# Patient Record
Sex: Female | Born: 1995 | Hispanic: No | Marital: Single | Smoking: Never smoker
Health system: Southern US, Community
[De-identification: ages and names within clinical notes are randomized; demographics above are authoritative.]

## PROBLEM LIST (undated history)

## (undated) ENCOUNTER — Inpatient Hospital Stay (HOSPITAL_COMMUNITY): Payer: Self-pay

## (undated) ENCOUNTER — Emergency Department (HOSPITAL_COMMUNITY): Admission: EM | Payer: Self-pay | Source: Home / Self Care

## (undated) DIAGNOSIS — Z789 Other specified health status: Secondary | ICD-10-CM

## (undated) DIAGNOSIS — J45909 Unspecified asthma, uncomplicated: Secondary | ICD-10-CM

## (undated) HISTORY — PX: NO PAST SURGERIES: SHX2092

## (undated) HISTORY — DX: Other specified health status: Z78.9

---
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
## 2016-08-26 ENCOUNTER — Ambulatory Visit: Payer: Medicaid Other | Admitting: Medical

## 2016-10-13 NOTE — L&D Delivery Note (Signed)
Patient is a 21 y.o. now G2P2 s/p NSVD at 5325w0d, who was admitted for IOL for postdates..  Delivery Note At 5:36 PM a viable female was delivered via  (Presentation:cephalic ; ROA ).  APGAR: , ; weight pending  Placenta status:intact , .  Cord: 3 vessel  with the following complications: none .    Anesthesia: epidural  Episiotomy: none Lacerations:  none Suture Repair: none Est. Blood Loss (mL): 350   Mom to postpartum.  Baby to Couplet care / Skin to Skin.   Head delivered ROA. Double nuchal cord and single body cord present and reduced. Shoulder and body delivered in usual fashion. Infant with spontaneous cry, placed on mother's abdomen, dried and bulb suctioned. Cord clamped x 2 after 1-minute delay, and cut by family member. Cord blood drawn. Placenta delivered spontaneously with gentle cord traction. Fundus firm with massage and Pitocin. Perineum inspected and found to have no laceration.  Teodoro KilKerrriann S. Aleya Durnell,  MD Family Medicine Resident PGY-1 08/24/17, 5:45 PM

## 2016-12-23 ENCOUNTER — Encounter: Payer: Self-pay | Admitting: Obstetrics and Gynecology

## 2016-12-23 ENCOUNTER — Ambulatory Visit (INDEPENDENT_AMBULATORY_CARE_PROVIDER_SITE_OTHER): Payer: Medicaid Other | Admitting: *Deleted

## 2016-12-23 DIAGNOSIS — Z3201 Encounter for pregnancy test, result positive: Secondary | ICD-10-CM

## 2016-12-23 DIAGNOSIS — Z3200 Encounter for pregnancy test, result unknown: Secondary | ICD-10-CM

## 2016-12-23 LAB — POCT PREGNANCY, URINE: Preg Test, Ur: POSITIVE — AB

## 2016-12-23 MED ORDER — PROMETHAZINE HCL 25 MG PO TABS
25.0000 mg | ORAL_TABLET | Freq: Four times a day (QID) | ORAL | 1 refills | Status: DC | PRN
Start: 2016-12-23 — End: 2017-06-25

## 2016-12-23 NOTE — Progress Notes (Signed)
Unsure LMP 10/17/16 - EDD 07/24/17.  Pt states she is having nausea and requests medication. Rx sent to pharmacy. Medication reconciliation completed.

## 2017-01-05 ENCOUNTER — Encounter: Payer: Self-pay | Admitting: Obstetrics & Gynecology

## 2017-01-05 ENCOUNTER — Ambulatory Visit (INDEPENDENT_AMBULATORY_CARE_PROVIDER_SITE_OTHER): Payer: Medicaid Other | Admitting: Obstetrics & Gynecology

## 2017-01-05 VITALS — BP 113/74 | HR 93 | Wt 140.0 lb

## 2017-01-05 DIAGNOSIS — Z3481 Encounter for supervision of other normal pregnancy, first trimester: Secondary | ICD-10-CM

## 2017-01-05 DIAGNOSIS — Z3401 Encounter for supervision of normal first pregnancy, first trimester: Secondary | ICD-10-CM

## 2017-01-05 DIAGNOSIS — Z3480 Encounter for supervision of other normal pregnancy, unspecified trimester: Secondary | ICD-10-CM

## 2017-01-05 DIAGNOSIS — Z3490 Encounter for supervision of normal pregnancy, unspecified, unspecified trimester: Secondary | ICD-10-CM | POA: Insufficient documentation

## 2017-01-05 NOTE — Patient Instructions (Signed)
First Trimester of Pregnancy The first trimester of pregnancy is from week 1 until the end of week 13 (months 1 through 3). A week after a sperm fertilizes an egg, the egg will implant on the wall of the uterus. This embryo will begin to develop into a baby. Genes from you and your partner will form the baby. The female genes will determine whether the baby will be a boy or a girl. At 6-8 weeks, the eyes and face will be formed, and the heartbeat can be seen on ultrasound. At the end of 12 weeks, all the baby's organs will be formed. Now that you are pregnant, you will want to do everything you can to have a healthy baby. Two of the most important things are to get good prenatal care and to follow your health care provider's instructions. Prenatal care is all the medical care you receive before the baby's birth. This care will help prevent, find, and treat any problems during the pregnancy and childbirth. Body changes during your first trimester Your body goes through many changes during pregnancy. The changes vary from woman to woman.  You may gain or lose a couple of pounds at first.  You may feel sick to your stomach (nauseous) and you may throw up (vomit). If the vomiting is uncontrollable, call your health care provider.  You may tire easily.  You may develop headaches that can be relieved by medicines. All medicines should be approved by your health care provider.  You may urinate more often. Painful urination may mean you have a bladder infection.  You may develop heartburn as a result of your pregnancy.  You may develop constipation because certain hormones are causing the muscles that push stool through your intestines to slow down.  You may develop hemorrhoids or swollen veins (varicose veins).  Your breasts may begin to grow larger and become tender. Your nipples may stick out more, and the tissue that surrounds them (areola) may become darker.  Your gums may bleed and may be  sensitive to brushing and flossing.  Dark spots or blotches (chloasma, mask of pregnancy) may develop on your face. This will likely fade after the baby is born.  Your menstrual periods will stop.  You may have a loss of appetite.  You may develop cravings for certain kinds of food.  You may have changes in your emotions from day to day, such as being excited to be pregnant or being concerned that something may go wrong with the pregnancy and baby.  You may have more vivid and strange dreams.  You may have changes in your hair. These can include thickening of your hair, rapid growth, and changes in texture. Some women also have hair loss during or after pregnancy, or hair that feels dry or thin. Your hair will most likely return to normal after your baby is born.  What to expect at prenatal visits During a routine prenatal visit:  You will be weighed to make sure you and the baby are growing normally.  Your blood pressure will be taken.  Your abdomen will be measured to track your baby's growth.  The fetal heartbeat will be listened to between weeks 10 and 14 of your pregnancy.  Test results from any previous visits will be discussed.  Your health care provider may ask you:  How you are feeling.  If you are feeling the baby move.  If you have had any abnormal symptoms, such as leaking fluid, bleeding, severe headaches,   or abdominal cramping.  If you are using any tobacco products, including cigarettes, chewing tobacco, and electronic cigarettes.  If you have any questions.  Other tests that may be performed during your first trimester include:  Blood tests to find your blood type and to check for the presence of any previous infections. The tests will also be used to check for low iron levels (anemia) and protein on red blood cells (Rh antibodies). Depending on your risk factors, or if you previously had diabetes during pregnancy, you may have tests to check for high blood  sugar that affects pregnant women (gestational diabetes).  Urine tests to check for infections, diabetes, or protein in the urine.  An ultrasound to confirm the proper growth and development of the baby.  Fetal screens for spinal cord problems (spina bifida) and Down syndrome.  HIV (human immunodeficiency virus) testing. Routine prenatal testing includes screening for HIV, unless you choose not to have this test.  You may need other tests to make sure you and the baby are doing well.  Follow these instructions at home: Medicines  Follow your health care provider's instructions regarding medicine use. Specific medicines may be either safe or unsafe to take during pregnancy.  Take a prenatal vitamin that contains at least 600 micrograms (mcg) of folic acid.  If you develop constipation, try taking a stool softener if your health care provider approves. Eating and drinking  Eat a balanced diet that includes fresh fruits and vegetables, whole grains, good sources of protein such as meat, eggs, or tofu, and low-fat dairy. Your health care provider will help you determine the amount of weight gain that is right for you.  Avoid raw meat and uncooked cheese. These carry germs that can cause birth defects in the baby.  Eating four or five small meals rather than three large meals a day may help relieve nausea and vomiting. If you start to feel nauseous, eating a few soda crackers can be helpful. Drinking liquids between meals, instead of during meals, also seems to help ease nausea and vomiting.  Limit foods that are high in fat and processed sugars, such as fried and sweet foods.  To prevent constipation: ? Eat foods that are high in fiber, such as fresh fruits and vegetables, whole grains, and beans. ? Drink enough fluid to keep your urine clear or pale yellow. Activity  Exercise only as directed by your health care provider. Most women can continue their usual exercise routine during  pregnancy. Try to exercise for 30 minutes at least 5 days a week. Exercising will help you: ? Control your weight. ? Stay in shape. ? Be prepared for labor and delivery.  Experiencing pain or cramping in the lower abdomen or lower back is a good sign that you should stop exercising. Check with your health care provider before continuing with normal exercises.  Try to avoid standing for long periods of time. Move your legs often if you must stand in one place for a long time.  Avoid heavy lifting.  Wear low-heeled shoes and practice good posture.  You may continue to have sex unless your health care provider tells you not to. Relieving pain and discomfort  Wear a good support bra to relieve breast tenderness.  Take warm sitz baths to soothe any pain or discomfort caused by hemorrhoids. Use hemorrhoid cream if your health care provider approves.  Rest with your legs elevated if you have leg cramps or low back pain.  If you develop   varicose veins in your legs, wear support hose. Elevate your feet for 15 minutes, 3-4 times a day. Limit salt in your diet. Prenatal care  Schedule your prenatal visits by the twelfth week of pregnancy. They are usually scheduled monthly at first, then more often in the last 2 months before delivery.  Write down your questions. Take them to your prenatal visits.  Keep all your prenatal visits as told by your health care provider. This is important. Safety  Wear your seat belt at all times when driving.  Make a list of emergency phone numbers, including numbers for family, friends, the hospital, and police and fire departments. General instructions  Ask your health care provider for a referral to a local prenatal education class. Begin classes no later than the beginning of month 6 of your pregnancy.  Ask for help if you have counseling or nutritional needs during pregnancy. Your health care provider can offer advice or refer you to specialists for help  with various needs.  Do not use hot tubs, steam rooms, or saunas.  Do not douche or use tampons or scented sanitary pads.  Do not cross your legs for long periods of time.  Avoid cat litter boxes and soil used by cats. These carry germs that can cause birth defects in the baby and possibly loss of the fetus by miscarriage or stillbirth.  Avoid all smoking, herbs, alcohol, and medicines not prescribed by your health care provider. Chemicals in these products affect the formation and growth of the baby.  Do not use any products that contain nicotine or tobacco, such as cigarettes and e-cigarettes. If you need help quitting, ask your health care provider. You may receive counseling support and other resources to help you quit.  Schedule a dentist appointment. At home, brush your teeth with a soft toothbrush and be gentle when you floss. Contact a health care provider if:  You have dizziness.  You have mild pelvic cramps, pelvic pressure, or nagging pain in the abdominal area.  You have persistent nausea, vomiting, or diarrhea.  You have a bad smelling vaginal discharge.  You have pain when you urinate.  You notice increased swelling in your face, hands, legs, or ankles.  You are exposed to fifth disease or chickenpox.  You are exposed to German measles (rubella) and have never had it. Get help right away if:  You have a fever.  You are leaking fluid from your vagina.  You have spotting or bleeding from your vagina.  You have severe abdominal cramping or pain.  You have rapid weight gain or loss.  You vomit blood or material that looks like coffee grounds.  You develop a severe headache.  You have shortness of breath.  You have any kind of trauma, such as from a fall or a car accident. Summary  The first trimester of pregnancy is from week 1 until the end of week 13 (months 1 through 3).  Your body goes through many changes during pregnancy. The changes vary from  woman to woman.  You will have routine prenatal visits. During those visits, your health care provider will examine you, discuss any test results you may have, and talk with you about how you are feeling. This information is not intended to replace advice given to you by your health care provider. Make sure you discuss any questions you have with your health care provider. Document Released: 09/23/2001 Document Revised: 09/10/2016 Document Reviewed: 09/10/2016 Elsevier Interactive Patient Education  2017 [employer]   Inc[employer]  

## 2017-01-05 NOTE — Progress Notes (Signed)
  Subjective:    Heather Gould is a G2P1001 5044w3d being seen today for her first obstetrical visit.  Her obstetrical history is significant for second pregnancy in 2 years. Patient does intend to breast feed. Pregnancy history fully reviewed.  Patient reports nausea.  Vitals:   01/05/17 1452  BP: 113/74  Pulse: 93  Weight: 140 lb (63.5 kg)    HISTORY: OB History  Gravida Para Term Preterm AB Living  2 1 1     1   SAB TAB Ectopic Multiple Live Births        0 1    # Outcome Date GA Lbr Len/2nd Weight Sex Delivery Anes PTL Lv  2 Current           1 Term 11/06/14 6631w6d 16:01 / 03:01 6 lb 6.7 oz (2.91 kg) F Vag-Spont EPI  LIV     Past Medical History:  Diagnosis Date  . Asthma    No past surgical history on file. Family History  Problem Relation Age of Onset  . Asthma Sister   . Asthma Brother   . Cancer Maternal Grandmother   . Heart disease Maternal Grandmother      Exam    Uterus:     Pelvic Exam:    Perineum: No Hemorrhoids   Vulva: normal   Vagina:  normal mucosa   pH:     Cervix: no lesions   Adnexa: no mass, fullness, tenderness   Bony Pelvis: average  System: Breast:  normal appearance, no masses or tenderness   Skin: normal coloration and turgor, no rashes    Neurologic: oriented, normal mood   Extremities: normal strength, tone, and muscle mass   HEENT neck supple with midline trachea   Mouth/Teeth mucous membranes moist, pharynx normal without lesions and dental hygiene good   Neck supple   Cardiovascular: regular rate and rhythm   Respiratory:  appears well, vitals normal, no respiratory distress, acyanotic, normal RR, neck free of mass or lymphadenopathy   Abdomen: soft, non-tender; bowel sounds normal; no masses,  no organomegaly   Urinary: urethral meatus normal      Assessment:    Pregnancy: G2P1001 Patient Active Problem List   Diagnosis Date Noted  . Supervision of normal pregnancy 01/05/2017        Plan:     Initial labs  drawn. Prenatal vitamins. Problem list reviewed and updated. Genetic Screening discussed First Screen: ordered.Viablity [place] in 4 days due to S<D  Ultrasound discussed; fetal survey: 18+ weeks.  Follow up in 4 weeks. 50% of 30 min visit spent on counseling and coordination of care.     Scheryl DarterJames Ugonna Keirsey 01/05/2017

## 2017-01-06 LAB — PRENATAL PROFILE I(LABCORP)
Antibody Screen: NEGATIVE
BASOS ABS: 0 10*3/uL (ref 0.0–0.2)
Basos: 0 %
EOS (ABSOLUTE): 0.2 10*3/uL (ref 0.0–0.4)
Eos: 2 %
HEMOGLOBIN: 12.9 g/dL (ref 11.1–15.9)
Hematocrit: 39.3 % (ref 34.0–46.6)
Hepatitis B Surface Ag: NEGATIVE
Immature Grans (Abs): 0 10*3/uL (ref 0.0–0.1)
Immature Granulocytes: 0 %
LYMPHS ABS: 1.9 10*3/uL (ref 0.7–3.1)
Lymphs: 22 %
MCH: 32.7 pg (ref 26.6–33.0)
MCHC: 32.8 g/dL (ref 31.5–35.7)
MCV: 100 fL — ABNORMAL HIGH (ref 79–97)
MONOS ABS: 0.6 10*3/uL (ref 0.1–0.9)
Monocytes: 7 %
NEUTROS ABS: 5.9 10*3/uL (ref 1.4–7.0)
Neutrophils: 69 %
Platelets: 229 10*3/uL (ref 150–379)
RBC: 3.95 x10E6/uL (ref 3.77–5.28)
RDW: 12.4 % (ref 12.3–15.4)
RH TYPE: POSITIVE
RPR Ser Ql: NONREACTIVE
Rubella Antibodies, IGG: 2.48 index (ref >0.99)
WBC: 8.6 10*3/uL (ref 3.4–10.8)

## 2017-01-06 LAB — HIV ANTIBODY (ROUTINE TESTING W REFLEX): HIV Screen 4th Generation wRfx: NONREACTIVE

## 2017-01-08 ENCOUNTER — Encounter: Payer: Medicaid Other | Admitting: Obstetrics and Gynecology

## 2017-01-09 ENCOUNTER — Other Ambulatory Visit: Payer: Self-pay | Admitting: Obstetrics & Gynecology

## 2017-01-09 ENCOUNTER — Ambulatory Visit (HOSPITAL_COMMUNITY)
Admission: RE | Admit: 2017-01-09 | Discharge: 2017-01-09 | Disposition: A | Discharge status: Home / Self Care | Payer: Medicaid Other | Source: Ambulatory Visit | Attending: Obstetrics & Gynecology | Admitting: Obstetrics & Gynecology

## 2017-01-09 DIAGNOSIS — O3481 Maternal care for other abnormalities of pelvic organs, first trimester: Secondary | ICD-10-CM | POA: Diagnosis not present

## 2017-01-09 DIAGNOSIS — Z3480 Encounter for supervision of other normal pregnancy, unspecified trimester: Secondary | ICD-10-CM

## 2017-01-09 DIAGNOSIS — Z3687 Encounter for antenatal screening for uncertain dates: Secondary | ICD-10-CM | POA: Insufficient documentation

## 2017-01-09 DIAGNOSIS — N8311 Corpus luteum cyst of right ovary: Secondary | ICD-10-CM | POA: Diagnosis not present

## 2017-01-09 DIAGNOSIS — Z3A08 8 weeks gestation of pregnancy: Secondary | ICD-10-CM | POA: Diagnosis not present

## 2017-01-14 ENCOUNTER — Ambulatory Visit (HOSPITAL_COMMUNITY): Payer: Medicaid Other

## 2017-02-02 ENCOUNTER — Encounter: Payer: Medicaid Other | Admitting: Obstetrics and Gynecology

## 2017-02-04 ENCOUNTER — Ambulatory Visit (INDEPENDENT_AMBULATORY_CARE_PROVIDER_SITE_OTHER): Payer: Medicaid Other | Admitting: Obstetrics & Gynecology

## 2017-02-04 VITALS — BP 126/68 | HR 91 | Wt 149.3 lb

## 2017-02-04 DIAGNOSIS — Z3481 Encounter for supervision of other normal pregnancy, first trimester: Secondary | ICD-10-CM

## 2017-02-04 DIAGNOSIS — O219 Vomiting of pregnancy, unspecified: Secondary | ICD-10-CM

## 2017-02-04 DIAGNOSIS — Z3682 Encounter for antenatal screening for nuchal translucency: Secondary | ICD-10-CM

## 2017-02-04 MED ORDER — METOCLOPRAMIDE HCL 10 MG PO TABS: 10.0000 mg | ORAL_TABLET | Freq: Four times a day (QID) | ORAL | 2 refills | Status: DC | PRN

## 2017-02-04 NOTE — Progress Notes (Signed)
   PRENATAL VISIT NOTE  Subjective:  Heather Gould is a 21 y.o. G2P1001 at [redacted]w[redacted]d being seen today for ongoing prenatal care.  She is currently monitored for the following issues for this low-risk pregnancy and has Supervision of normal pregnancy on her problem list.  Patient reports no complaints. Desires a non-drowsy antiemetic for occasional nausea and vomiting.  Contractions: Not present. Vag. Bleeding: None.  Movement: Absent. Denies leaking of fluid.   The following portions of the patient's history were reviewed and updated as appropriate: allergies, current medications, past family history, past medical history, past social history, past surgical history and problem list. Problem list updated.  Objective:   Vitals:   02/04/17 1622  BP: 126/68  Pulse: 91  Weight: 149 lb 4.8 oz (67.7 kg)    Fetal Status: Fetal Heart Rate (bpm): 164   Movement: Absent     General:  Alert, oriented and cooperative. Patient is in no acute distress.  Skin: Skin is warm and dry. No rash noted.   Cardiovascular: Normal heart rate noted  Respiratory: Normal respiratory effort, no problems with respiration noted  Abdomen: Soft, gravid, appropriate for gestational age. Pain/Pressure: Present     Pelvic:  Cervical exam deferred        Extremities: Normal range of motion.  Edema: None  Mental Status: Normal mood and affect. Normal behavior. Normal judgment and thought content.   Assessment and Plan:  Pregnancy: G2P1001 at [redacted]w[redacted]d  1. Nausea/vomiting in pregnancy Reglan ordered. - metoCLOPramide (REGLAN) 10 MG tablet; Take 1 tablet (10 mg total) by mouth 4 (four) times daily as needed for nausea or vomiting.  Dispense: 30 tablet; Refill: 2  2. Encounter for nuchal translucency testing Will try to get her first trimester screening.  If not available, will do second trimester screening/quad screen next visit. - [place] MFM Fetal Nuchal Translucency; Future  3. Encounter for supervision of other normal  pregnancy in first trimester  No other complaints or concerns.  Routine obstetric precautions reviewed. Please refer to After Visit Summary for other counseling recommendations.  Return in about 4 weeks (around 03/04/2017) for OB Visit.   Tereso Newcomer, MD

## 2017-02-04 NOTE — Patient Instructions (Signed)
Return to clinic for any scheduled appointments or obstetric concerns, or go to [facility] for evaluation    Second Trimester of Pregnancy The second trimester is from week 14 through week 27 (months 4 through 6). The second trimester is often a time when you feel your best. Your body has adjusted to being pregnant, and you begin to feel better physically. Usually, morning sickness has lessened or quit completely, you may have more energy, and you may have an increase in appetite. The second trimester is also a time when the fetus is growing rapidly. At the end of the sixth month, the fetus is about 9 inches long and weighs about 1 pounds. You will likely begin to feel the baby move (quickening) between 16 and 20 weeks of pregnancy. Body changes during your second trimester Your body continues to go through many changes during your second trimester. The changes vary from woman to woman.  Your weight will continue to increase. You will notice your lower abdomen bulging out.  You may begin to get stretch marks on your hips, abdomen, and breasts.  You may develop headaches that can be relieved by medicines. The medicines should be approved by your health care provider.  You may urinate more often because the fetus is pressing on your bladder.  You may develop or continue to have heartburn as a result of your pregnancy.  You may develop constipation because certain hormones are causing the muscles that push waste through your intestines to slow down.  You may develop hemorrhoids or swollen, bulging veins (varicose veins).  You may have back pain. This is caused by: ? Weight gain. ? Pregnancy hormones that are relaxing the joints in your pelvis. ? A shift in weight and the muscles that support your balance.  Your breasts will continue to grow and they will continue to become tender.  Your gums may bleed and may be sensitive to brushing and flossing.  Dark spots or blotches (chloasma, mask of  pregnancy) may develop on your face. This will likely fade after the baby is born.  A dark line from your belly button to the pubic area (linea nigra) may appear. This will likely fade after the baby is born.  You may have changes in your hair. These can include thickening of your hair, rapid growth, and changes in texture. Some women also have hair loss during or after pregnancy, or hair that feels dry or thin. Your hair will most likely return to normal after your baby is born.  What to expect at prenatal visits During a routine prenatal visit:  You will be weighed to make sure you and the fetus are growing normally.  Your blood pressure will be taken.  Your abdomen will be measured to track your baby's growth.  The fetal heartbeat will be listened to.  Any test results from the previous visit will be discussed.  Your health care provider may ask you:  How you are feeling.  If you are feeling the baby move.  If you have had any abnormal symptoms, such as leaking fluid, bleeding, severe headaches, or abdominal cramping.  If you are using any tobacco products, including cigarettes, chewing tobacco, and electronic cigarettes.  If you have any questions.  Other tests that may be performed during your second trimester include:  Blood tests that check for: ? Low iron levels (anemia). ? High blood sugar that affects pregnant women (gestational diabetes) between 24 and 28 weeks. ? Rh antibodies. This is to   check for a protein on red blood cells (Rh factor).  Urine tests to check for infections, diabetes, or protein in the urine.  An ultrasound to confirm the proper growth and development of the baby.  An amniocentesis to check for possible genetic problems.  Fetal screens for spina bifida and Down syndrome.  HIV (human immunodeficiency virus) testing. Routine prenatal testing includes screening for HIV, unless you choose not to have this test.  Follow these instructions at  home: Medicines  Follow your health care provider's instructions regarding medicine use. Specific medicines may be either safe or unsafe to take during pregnancy.  Take a prenatal vitamin that contains at least 600 micrograms (mcg) of folic acid.  If you develop constipation, try taking a stool softener if your health care provider approves. Eating and drinking  Eat a balanced diet that includes fresh fruits and vegetables, whole grains, good sources of protein such as meat, eggs, or tofu, and low-fat dairy. Your health care provider will help you determine the amount of weight gain that is right for you.  Avoid raw meat and uncooked cheese. These carry germs that can cause birth defects in the baby.  If you have low calcium intake from food, talk to your health care provider about whether you should take a daily calcium supplement.  Limit foods that are high in fat and processed sugars, such as fried and sweet foods.  To prevent constipation: ? Drink enough fluid to keep your urine clear or pale yellow. ? Eat foods that are high in fiber, such as fresh fruits and vegetables, whole grains, and beans. Activity  Exercise only as directed by your health care provider. Most women can continue their usual exercise routine during pregnancy. Try to exercise for 30 minutes at least 5 days a week. Stop exercising if you experience uterine contractions.  Avoid heavy lifting, wear low heel shoes, and practice good posture.  A sexual relationship may be continued unless your health care provider directs you otherwise. Relieving pain and discomfort  Wear a good support bra to prevent discomfort from breast tenderness.  Take warm sitz baths to soothe any pain or discomfort caused by hemorrhoids. Use hemorrhoid cream if your health care provider approves.  Rest with your legs elevated if you have leg cramps or low back pain.  If you develop varicose veins, wear support hose. Elevate your feet  for 15 minutes, 3-4 times a day. Limit salt in your diet. Prenatal Care  Write down your questions. Take them to your prenatal visits.  Keep all your prenatal visits as told by your health care provider. This is important. Safety  Wear your seat belt at all times when driving.  Make a list of emergency phone numbers, including numbers for family, friends, the hospital, and police and fire departments. General instructions  Ask your health care provider for a referral to a local prenatal education class. Begin classes no later than the beginning of month 6 of your pregnancy.  Ask for help if you have counseling or nutritional needs during pregnancy. Your health care provider can offer advice or refer you to specialists for help with various needs.  Do not use hot tubs, steam rooms, or saunas.  Do not douche or use tampons or scented sanitary pads.  Do not cross your legs for long periods of time.  Avoid cat litter boxes and soil used by cats. These carry germs that can cause birth defects in the baby and possibly loss of the   fetus by miscarriage or stillbirth.  Avoid all smoking, herbs, alcohol, and unprescribed drugs. Chemicals in these products can affect the formation and growth of the baby.  Do not use any products that contain nicotine or tobacco, such as cigarettes and e-cigarettes. If you need help quitting, ask your health care provider.  Visit your dentist if you have not gone yet during your pregnancy. Use a soft toothbrush to brush your teeth and be gentle when you floss. Contact a health care provider if:  You have dizziness.  You have mild pelvic cramps, pelvic pressure, or nagging pain in the abdominal area.  You have persistent nausea, vomiting, or diarrhea.  You have a bad smelling vaginal discharge.  You have pain when you urinate. Get help right away if:  You have a fever.  You are leaking fluid from your vagina.  You have spotting or bleeding from your  vagina.  You have severe abdominal cramping or pain.  You have rapid weight gain or weight loss.  You have shortness of breath with chest pain.  You notice sudden or extreme swelling of your face, hands, ankles, feet, or legs.  You have not felt your baby move in over an hour.  You have severe headaches that do not go away when you take medicine.  You have vision changes. Summary  The second trimester is from week 14 through week 27 (months 4 through 6). It is also a time when the fetus is growing rapidly.  Your body goes through many changes during pregnancy. The changes vary from woman to woman.  Avoid all smoking, herbs, alcohol, and unprescribed drugs. These chemicals affect the formation and growth your baby.  Do not use any tobacco products, such as cigarettes, chewing tobacco, and e-cigarettes. If you need help quitting, ask your health care provider.  Contact your health care provider if you have any questions. Keep all prenatal visits as told by your health care provider. This is important. This information is not intended to replace advice given to you by your health care provider. Make sure you discuss any questions you have with your health care provider. Document Released: 09/23/2001 Document Revised: 03/06/2016 Document Reviewed: 11/30/2012 Elsevier Interactive Patient Education  2017 [employer]  

## 2017-02-16 ENCOUNTER — Emergency Department (HOSPITAL_COMMUNITY)
Admission: EM | Admit: 2017-02-16 | Discharge: 2017-02-16 | Disposition: A | Discharge status: Home / Self Care | Payer: Medicaid Other | Attending: Emergency Medicine | Admitting: Emergency Medicine

## 2017-02-16 ENCOUNTER — Encounter (HOSPITAL_COMMUNITY): Payer: Self-pay | Admitting: *Deleted

## 2017-02-16 DIAGNOSIS — J45909 Unspecified asthma, uncomplicated: Secondary | ICD-10-CM | POA: Diagnosis not present

## 2017-02-16 DIAGNOSIS — R05 Cough: Secondary | ICD-10-CM | POA: Diagnosis not present

## 2017-02-16 DIAGNOSIS — Z79899 Other long term (current) drug therapy: Secondary | ICD-10-CM | POA: Diagnosis not present

## 2017-02-16 DIAGNOSIS — R059 Cough, unspecified: Secondary | ICD-10-CM

## 2017-02-16 MED ORDER — CETIRIZINE HCL 10 MG PO TABS: 10.0000 mg | ORAL_TABLET | Freq: Every day | ORAL | 0 refills | Status: DC

## 2017-02-16 MED ORDER — GUAIFENESIN 100 MG/5ML PO SOLN: 5.0000 mL | ORAL | 0 refills | Status: DC | PRN

## 2017-02-16 NOTE — ED Provider Notes (Signed)
[facility]-EMERGENCY DEPT Provider Note   CSN: [id number] Arrival date & time: 02/16/17  1017   By signing my name below, I, Bobbie Stack, attest that this documentation has been prepared under the direction and in the presence of Buel Ream, PA-C. Electronically Signed: Bobbie Stack, Scribe. 02/16/17. 12:07 PM. History   Chief Complaint Chief Complaint  Patient presents with  . Cough     The history is provided by the patient. No language interpreter was used.  HPI Comments: Heather Gould is a 21 y.o. female who presents to the Emergency Department complaining of a persistent non-productive cough for the past 4 days. She reports chest pain, sore throat, and congestion recently. She states that she only has chest pain whenever she coughs. She is currently 4 months pregnant. She has no pregnancy complaints. She denies hx of asthma or allergies. She denies fevers, SOB, ear pain, or difficulty swallowing.  Past Medical History:  Diagnosis Date  . Asthma     Patient Active Problem List   Diagnosis Date Noted  . Supervision of normal pregnancy 01/05/2017    History reviewed. No pertinent surgical history.  OB History    Gravida Para Term Preterm AB Living   2 1 1     1    SAB TAB Ectopic Multiple Live Births         0 1       Home Medications    Prior to Admission medications   Medication Sig Start Date End Date Taking? Authorizing Provider  cetirizine (ZYRTEC ALLERGY) 10 MG tablet Take 1 tablet (10 mg total) by mouth daily. 02/16/17   Jeronda Don, Waylan Boga, PA-C  guaiFENesin (ROBITUSSIN) 100 MG/5ML SOLN Take 5 mLs (100 mg total) by mouth every 4 (four) hours as needed for cough or to loosen phlegm. 02/16/17   Yakira Duquette, Waylan Boga, PA-C  metoCLOPramide (REGLAN) 10 MG tablet Take 1 tablet (10 mg total) by mouth 4 (four) times daily as needed for nausea or vomiting. 02/04/17   Anyanwu, Jethro Bastos, MD  Prenatal Vit-Fe Fumarate-FA (MULTIVITAMIN-PRENATAL) 27-0.8 MG TABS tablet Take 1  tablet by mouth daily at 12 noon.    [provider]  promethazine (PHENERGAN) 25 MG tablet Take 1 tablet (25 mg total) by mouth every 6 (six) hours as needed for nausea or vomiting. Patient not taking: Reported on 02/04/2017 12/23/16   Levie Heritage, DO    Family History Family History  Problem Relation Age of Onset  . Asthma Sister   . Asthma Brother   . Cancer Maternal Grandmother   . Heart disease Maternal Grandmother     Social History Social History  Substance Use Topics  . Smoking status: Never Smoker  . Smokeless tobacco: Never Used  . Alcohol use No     Allergies   Patient has no known allergies.   Review of Systems Review of Systems  Constitutional: Negative for fever.  HENT: Positive for congestion and sore throat. Negative for ear pain and trouble swallowing.   Respiratory: Positive for cough. Negative for shortness of breath.   Gastrointestinal: Negative for abdominal pain.     Physical Exam Updated Vital Signs BP 109/64 (BP Location: Left Arm)   Pulse 100   Temp 98.3 F (36.8 C) (Oral)   Resp 18   LMP 10/17/2016 (Within Days)   SpO2 95%   Physical Exam  Constitutional: She appears well-developed and well-nourished. No distress.  HENT:  Head: Normocephalic and atraumatic.  Right Ear: Tympanic membrane normal.  Left  Ear: Tympanic membrane normal.  Mouth/Throat: Oropharynx is clear and moist and mucous membranes are normal. No trismus in the jaw. No uvula swelling. No oropharyngeal exudate, posterior oropharyngeal edema, posterior oropharyngeal erythema or tonsillar abscesses. Tonsils are 0 on the right. Tonsils are 0 on the left. No tonsillar exudate.  Eyes: Conjunctivae are normal. Pupils are equal, round, and reactive to light. Right eye exhibits no discharge. Left eye exhibits no discharge. No scleral icterus.  Neck: Normal range of motion. Neck supple. No thyromegaly present.  Cardiovascular: Normal rate, regular rhythm, normal heart  sounds and intact distal pulses.  Exam reveals no gallop and no friction rub.   No murmur heard. Pulmonary/Chest: Effort normal and breath sounds normal. No stridor. No respiratory distress. She has no wheezes. She has no rales. She exhibits no tenderness.  Abdominal: Soft. Bowel sounds are normal. She exhibits no distension. There is no tenderness. There is no rebound and no guarding.  Musculoskeletal: She exhibits no edema.  Lymphadenopathy:    She has no cervical adenopathy.  Neurological: She is alert. Coordination normal.  Skin: Skin is warm and dry. No rash noted. She is not diaphoretic. No pallor.  Psychiatric: She has a normal mood and affect.  Nursing note and vitals reviewed.  ED Treatments / Results  DIAGNOSTIC STUDIES: Oxygen Saturation is 95% on RA, adequate by my interpretation.    COORDINATION OF CARE: 11:33 PM Discussed treatment plan with pt at bedside, which includes labs, and pt agreed to plan.   Labs (all labs ordered are listed, but only abnormal results are displayed) Labs Reviewed - No data to display  EKG  EKG Interpretation None       Radiology No results found.  Procedures Procedures (including critical care time)  Medications Ordered in ED Medications - No data to display   Initial Impression / Assessment and Plan / ED Course  I have reviewed the triage vital signs and the nursing notes.  Pertinent labs & imaging results that were available during my care of the patient were reviewed by me and considered in my medical decision making (see chart for details).     Patient with most likely upper respiratory infection vs. allergy symptoms, considering sore throat and nasal congestion. Doubt PE as cause of chest pain and cough. Patient is well-appearing with stable vitals. Will treat symptomatically with strict return precautions. Discharge home with Robitussin, and Zyrtec. Both medications safe in pregnancy. Advised Tylenol for sore throat.  Patient advised to follow up with PCP if symptoms are not improving. Patient understands and agrees with plan. Patient discharged in satisfactory condition.  Final Clinical Impressions(s) / ED Diagnoses   Final diagnoses:  Cough    New Prescriptions Discharge Medication List as of 02/16/2017 12:12 PM    START taking these medications   Details  cetirizine (ZYRTEC ALLERGY) 10 MG tablet Take 1 tablet (10 mg total) by mouth daily., Starting Mon 02/16/2017, Print    guaiFENesin (ROBITUSSIN) 100 MG/5ML SOLN Take 5 mLs (100 mg total) by mouth every 4 (four) hours as needed for cough or to loosen phlegm., Starting Mon 02/16/2017, Print       I personally performed the services described in this documentation, which was scribed in my presence. The recorded information has been reviewed and is accurate.    Emi HolesLaw, Vallorie Niccoli M, PA-C 02/16/17 1607    Raeford RazorKohut, Stephen, MD 02/17/17 2106

## 2017-02-16 NOTE — ED Triage Notes (Signed)
Pt reports being approx 4 months pregnant. Has nonproductive cough x 4 days with sore throat and chest wall pain when she coughs. No acute distress noted at triage.

## 2017-02-16 NOTE — Discharge Instructions (Signed)
Medications: Zyrtec, Robitussin  Treatment: Takes Zyrtec once daily for nasal congestion. Take Robitussin every 4 hours as needed for cough. Take Tylenol as prescribed over-the-counter as needed for sore throat. You can also gargle with warm saltwater.  Follow-up: Please follow-up with your primary care provider or OB/GYN if your symptoms are not improving over the next 7 days. Please return to emergency department if you develop any new or worsening symptoms including shortness of breath, persistent fever over 100.4, or any other concerning symptoms.

## 2017-02-16 NOTE — ED Notes (Signed)
Declined W/C at D/C and was escorted to lobby by RN. 

## 2017-03-05 ENCOUNTER — Ambulatory Visit (INDEPENDENT_AMBULATORY_CARE_PROVIDER_SITE_OTHER): Payer: Medicaid Other | Admitting: Obstetrics and Gynecology

## 2017-03-05 VITALS — BP 132/72 | HR 84 | Wt 156.5 lb

## 2017-03-05 DIAGNOSIS — Z3482 Encounter for supervision of other normal pregnancy, second trimester: Secondary | ICD-10-CM | POA: Diagnosis not present

## 2017-03-05 NOTE — Progress Notes (Signed)
   PRENATAL VISIT NOTE  Subjective:  Heather Gould is a 21 y.o. G2P1001 at 4476w3d being seen today for ongoing prenatal care.  She is currently monitored for the following issues for this low-risk pregnancy and has Supervision of normal pregnancy on her problem list.  Patient reports no complaints.  Contractions: Not present. Vag. Bleeding: None.  Movement: Absent. Denies leaking of fluid.   The following portions of the patient's history were reviewed and updated as appropriate: allergies, current medications, past family history, past medical history, past social history, past surgical history and problem list. Problem list updated.  Objective:   Vitals:   03/05/17 1248  BP: 132/72  Pulse: 84  Weight: 156 lb 8 oz (71 kg)    Fetal Status: Fetal Heart Rate (bpm): 154   Movement: Absent     General:  Alert, oriented and cooperative. Patient is in no acute distress.  Skin: Skin is warm and dry. No rash noted.   Cardiovascular: Normal heart rate noted  Respiratory: Normal respiratory effort, no problems with respiration noted  Abdomen: Soft, gravid, appropriate for gestational age. Pain/Pressure: Absent     Pelvic:  Cervical exam deferred        Extremities: Normal range of motion.  Edema: None  Mental Status: Normal mood and affect. Normal behavior. Normal judgment and thought content.   Assessment and Plan:  Pregnancy: G2P1001 at 576w3d  1. Encounter for supervision of other normal pregnancy in second trimester Patient is doing well without complaints Anatomy ultrasound ordered Quad screen today - US MFM OB COMP + 14 WK; Future - AFP TETRA  General obstetric precautions including but not limited to vaginal bleeding, contractions, leaking of fluid and fetal movement were reviewed in detail with the patient. Please refer to After Visit Summary for other counseling recommendations.  Return in about 4 weeks (around 04/02/2017) for ROB.   Catalina AntiguaPeggy Bradford Cazier, MD

## 2017-03-12 LAB — AFP TETRA
DIA Mom Value: 0.5
DIA Value (EIA): 82.29 pg/mL
DSR (By Age)    1 IN: 1156
DSR (Second Trimester) 1 IN: 10000
GESTATIONAL AGE AFP: 16.3 wk
MSAFP MOM: 0.94
MSAFP: 34.6 ng/mL
MSHCG Mom: 0.56
MSHCG: 19922 m[IU]/mL
Maternal Age At EDD: 20.5 yr
Osb Risk: 10000
Test Results:: NEGATIVE
UE3 MOM: 1.21
UE3 VALUE: 0.99 ng/mL
Weight: 156 [lb_av]

## 2017-04-01 ENCOUNTER — Encounter: Payer: Self-pay | Admitting: *Deleted

## 2017-04-02 ENCOUNTER — Other Ambulatory Visit: Payer: Self-pay | Admitting: Obstetrics and Gynecology

## 2017-04-02 ENCOUNTER — Ambulatory Visit (HOSPITAL_COMMUNITY)
Admission: RE | Admit: 2017-04-02 | Discharge: 2017-04-02 | Disposition: A | Discharge status: Home / Self Care | Payer: Medicaid Other | Source: Ambulatory Visit | Attending: Obstetrics and Gynecology | Admitting: Obstetrics and Gynecology

## 2017-04-02 ENCOUNTER — Ambulatory Visit (INDEPENDENT_AMBULATORY_CARE_PROVIDER_SITE_OTHER): Payer: Medicaid Other | Admitting: Family Medicine

## 2017-04-02 ENCOUNTER — Other Ambulatory Visit (HOSPITAL_COMMUNITY)
Admission: RE | Admit: 2017-04-02 | Discharge: 2017-04-02 | Disposition: A | Discharge status: Home / Self Care | Payer: Medicaid Other | Source: Ambulatory Visit | Attending: Family Medicine | Admitting: Family Medicine

## 2017-04-02 ENCOUNTER — Encounter: Payer: Self-pay | Admitting: Family Medicine

## 2017-04-02 VITALS — BP 128/74 | HR 93 | Wt 162.0 lb

## 2017-04-02 DIAGNOSIS — Z363 Encounter for antenatal screening for malformations: Secondary | ICD-10-CM | POA: Diagnosis not present

## 2017-04-02 DIAGNOSIS — Z36 Encounter for antenatal screening for chromosomal anomalies: Secondary | ICD-10-CM

## 2017-04-02 DIAGNOSIS — Z3482 Encounter for supervision of other normal pregnancy, second trimester: Secondary | ICD-10-CM | POA: Diagnosis present

## 2017-04-02 DIAGNOSIS — Z3A20 20 weeks gestation of pregnancy: Secondary | ICD-10-CM | POA: Insufficient documentation

## 2017-04-02 DIAGNOSIS — Z113 Encounter for screening for infections with a predominantly sexual mode of transmission: Secondary | ICD-10-CM

## 2017-04-02 NOTE — Addendum Note (Signed)
Addended by: Faythe CasaBELLAMY, Vira Chaplin M on: 04/02/2017 01:30 PM   Modules accepted: Orders

## 2017-04-02 NOTE — Progress Notes (Signed)
      Subjective:  Ernie AvenaMartha Dimichele is a 21 y.o. G2P1001 at 4752w3d being seen today for ongoing prenatal care.  She is currently monitored for the following issues for this low-risk pregnancy and has Supervision of normal pregnancy on her problem list.  Patient reports no complaints.  Contractions: Not present. Vag. Bleeding: None.  Movement: Present. Denies leaking of fluid.   The following portions of the patient's history were reviewed and updated as appropriate: allergies, current medications, past family history, past medical history, past social history, past surgical history and problem list. Problem list updated.  Objective:   Vitals:   04/02/17 1247  BP: 128/74  Pulse: 93  Weight: 162 lb (73.5 kg)    Fetal Status: Fetal Heart Rate (bpm): 145   Movement: Present      General:  Alert, oriented and cooperative. Patient is in no acute distress.  Skin: Skin is warm and dry. No rash noted.   Cardiovascular: Normal heart rate noted  Respiratory: Normal respiratory effort, no problems with respiration noted  Abdomen: Soft, gravid, appropriate for gestational age. Pain/Pressure: Present     Pelvic:  Cervical exam deferred        Extremities: Normal range of motion.  Edema: None  Mental Status: Normal mood and affect. Normal behavior. Normal judgment and thought content.   Urinalysis:      Assessment and Plan:  Pregnancy: G2P1001 at 4052w3d  1. Encounter for supervision of other normal pregnancy in second trimester - History of shortened cervix with last pregnancy but delivered full term at 38 weeks - CL 3.1 cm   MOC: Nexplanon MOF: Both Preterm labor symptoms and general obstetric precautions including but not limited to vaginal bleeding, contractions, leaking of fluid and fetal movement were reviewed in detail with the patient. Please refer to After Visit Summary for other counseling recommendations.  Return in about 4 weeks (around 04/30/2017) for Routine OB  visit.   Jen MowElizabeth Mumaw, DO [facility]

## 2017-04-02 NOTE — Patient Instructions (Signed)
Second Trimester of Pregnancy The second trimester is from week 13 through week 28, month 4 through 6. This is often the time in pregnancy that you feel your best. Often times, morning sickness has lessened or quit. You may have more energy, and you may get hungry more often. Your unborn baby (fetus) is growing rapidly. At the end of the sixth month, he or she is about 9 inches long and weighs about 1 pounds. You will likely feel the baby move (quickening) between 18 and 20 weeks of pregnancy. Follow these instructions at home:  Avoid all smoking, herbs, and alcohol. Avoid drugs not approved by your doctor.  Do not use any tobacco products, including cigarettes, chewing tobacco, and electronic cigarettes. If you need help quitting, ask your doctor. You may get counseling or other support to help you quit.  Only take medicine as told by your doctor. Some medicines are safe and some are not during pregnancy.  Exercise only as told by your doctor. Stop exercising if you start having cramps.  Eat regular, healthy meals.  Wear a good support bra if your breasts are tender.  Do not use hot tubs, steam rooms, or saunas.  Wear your seat belt when driving.  Avoid raw meat, uncooked cheese, and liter boxes and soil used by cats.  Take your prenatal vitamins.  Take 1500-2000 milligrams of calcium daily starting at the 20th week of pregnancy until you deliver your baby.  Try taking medicine that helps you poop (stool softener) as needed, and if your doctor approves. Eat more fiber by eating fresh fruit, vegetables, and whole grains. Drink enough fluids to keep your pee (urine) clear or pale yellow.  Take warm water baths (sitz baths) to soothe pain or discomfort caused by hemorrhoids. Use hemorrhoid cream if your doctor approves.  If you have puffy, bulging veins (varicose veins), wear support hose. Raise (elevate) your feet for 15 minutes, 3-4 times a day. Limit salt in your diet.  Avoid heavy  lifting, wear low heals, and sit up straight.  Rest with your legs raised if you have leg cramps or low back pain.  Visit your dentist if you have not gone during your pregnancy. Use a soft toothbrush to brush your teeth. Be gentle when you floss.  You can have sex (intercourse) unless your doctor tells you not to.  Go to your doctor visits. Get help if:  You feel dizzy.  You have mild cramps or pressure in your lower belly (abdomen).  You have a nagging pain in your belly area.  You continue to feel sick to your stomach (nauseous), throw up (vomit), or have watery poop (diarrhea).  You have bad smelling fluid coming from your vagina.  You have pain with peeing (urination). Get help right away if:  You have a fever.  You are leaking fluid from your vagina.  You have spotting or bleeding from your vagina.  You have severe belly cramping or pain.  You lose or gain weight rapidly.  You have trouble catching your breath and have chest pain.  You notice sudden or extreme puffiness (swelling) of your face, hands, ankles, feet, or legs.  You have not felt the baby move in over an hour.  You have severe headaches that do not go away with medicine.  You have vision changes. This information is not intended to replace advice given to you by your health care provider. Make sure you discuss any questions you have with your health care   provider. Document Released: 12/24/2009 Document Revised: 03/06/2016 Document Reviewed: 11/30/2012 Elsevier Interactive Patient Education  2017 [employer] Inc[employer]  

## 2017-04-03 LAB — GC/CHLAMYDIA PROBE AMP (~~LOC~~) NOT AT ARMC
Chlamydia: NEGATIVE
Neisseria Gonorrhea: NEGATIVE

## 2017-04-30 ENCOUNTER — Ambulatory Visit (INDEPENDENT_AMBULATORY_CARE_PROVIDER_SITE_OTHER): Payer: Medicaid Other | Admitting: Family Medicine

## 2017-04-30 DIAGNOSIS — Z3482 Encounter for supervision of other normal pregnancy, second trimester: Secondary | ICD-10-CM

## 2017-04-30 NOTE — Progress Notes (Signed)
Educated pt on [facility] [place] 

## 2017-04-30 NOTE — Progress Notes (Signed)
   PRENATAL VISIT NOTE  Subjective:  Heather Gould is a 21 y.o. G2P1001 at 7280w3d being seen today for ongoing prenatal care.  She is currently monitored for the following issues for this high-risk pregnancy and has Supervision of normal pregnancy on her problem list. She had a shortened cervix in her first pregnancy, delivered vaginally at term without complications.  Patient reports sx consistent with plantar fasciitis for the past several weeks. Her sx are worse after a long day of standing in unsupportive shoes. She is going to breastfeed the first 6 months after delivery, and then switch to formula. Patient is unsure of method of contraception after delivery, but is possibly interested in IUD.   Contractions: Not present. Vag. Bleeding: None.  Movement: Present. Denies leaking of fluid.   The following portions of the patient's history were reviewed and updated as appropriate: allergies, current medications, past family history, past medical history, past social history, past surgical history and problem list. Problem list updated.  Objective:   Vitals:   04/30/17 1403  BP: 121/74  Pulse: 97  Weight: 170 lb 6.4 oz (77.3 kg)    Fetal Status: Fetal Heart Rate (bpm): 165 Fundal Height: 23 cm Movement: Present     General:  Alert, oriented and cooperative. Patient is in no acute distress.  Skin: Skin is warm and dry. No rash noted.   Cardiovascular: Normal heart rate noted  Respiratory: Normal respiratory effort, no problems with respiration noted  Abdomen: Soft, gravid, appropriate for gestational age (23 cm FH).  Pain/Pressure: Present     Pelvic: Cervical exam deferred        Extremities: Normal range of motion.  Edema: None  Mental Status:  Normal mood and affect. Normal behavior. Normal judgment and thought content.   Assessment and Plan:  Pregnancy: G2P1001 at 7180w3d  1. Encounter for supervision of other normal pregnancy in second trimester  Patient is a 21 y/o female here  for routine prenatal examination who is progressing well through her pregnancy. She has no concerns related to her pregnancy today. She is going to continue to consider whether or not she wants her baby to have a circumcision or not.  Term/Preterm labor symptoms and general obstetric precautions including but not limited to vaginal bleeding, contractions, leaking of fluid and fetal movement were reviewed in detail with the patient. Please refer to After Visit Summary for other counseling recommendations.  Return in about 4 weeks (around 05/28/2017).  Recommend supportive shoes, stretches with a tennis ball, and ice application to sole of feet to help alleviate sx of plantar fasciitis.   Lucendia HerrlichLaura Josephine Gould, [place] NeighborStudent-PA

## 2017-05-28 ENCOUNTER — Ambulatory Visit (INDEPENDENT_AMBULATORY_CARE_PROVIDER_SITE_OTHER): Payer: Medicaid Other | Admitting: Obstetrics & Gynecology

## 2017-05-28 ENCOUNTER — Encounter: Payer: Self-pay | Admitting: Obstetrics & Gynecology

## 2017-05-28 DIAGNOSIS — Z23 Encounter for immunization: Secondary | ICD-10-CM | POA: Diagnosis not present

## 2017-05-28 DIAGNOSIS — Z3482 Encounter for supervision of other normal pregnancy, second trimester: Secondary | ICD-10-CM

## 2017-05-28 NOTE — Progress Notes (Signed)
   PRENATAL VISIT NOTE  Subjective:  Heather Gould is a 21 y.o. G2P1001 at 6973w3d being seen today for ongoing prenatal care.  She is currently monitored for the following issues for this low-risk pregnancy and has Supervision of normal pregnancy on her problem list.  Patient reports wisdom teeth erupting.  Contractions: Not present. Vag. Bleeding: None.  Movement: Present. Denies leaking of fluid.   The following portions of the patient's history were reviewed and updated as appropriate: allergies, current medications, past family history, past medical history, past social history, past surgical history and problem list. Problem list updated.  Objective:   Vitals:   05/28/17 0853  BP: 119/76  Pulse: 96  Weight: 174 lb 4.8 oz (79.1 kg)    Fetal Status: Fetal Heart Rate (bpm): 145 Fundal Height: 28 cm Movement: Present     General:  Alert, oriented and cooperative. Patient is in no acute distress.  Skin: Skin is warm and dry. No rash noted.   Cardiovascular: Normal heart rate noted  Respiratory: Normal respiratory effort, no problems with respiration noted  Abdomen: Soft, gravid, appropriate for gestational age.  Pain/Pressure: Absent     Pelvic: Cervical exam deferred        Extremities: Normal range of motion.  Edema: Trace  Mental Status:  Normal mood and affect. Normal behavior. Normal judgment and thought content.   Assessment and Plan:  Pregnancy: G2P1001 at 11073w3d  1. Encounter for supervision of other normal pregnancy in second trimester Routine 28 week labs - Glucose Tolerance, 2 Hours w/1 Hour - HIV antibody - RPR - CBC - Tdap vaccine greater than or equal to 7yo IM  Preterm labor symptoms and general obstetric precautions including but not limited to vaginal bleeding, contractions, leaking of fluid and fetal movement were reviewed in detail with the patient. Please refer to After Visit Summary for other counseling recommendations.  Return in about 2 weeks (around  06/11/2017).   Scheryl DarterJames Catalea Labrecque, MD

## 2017-05-28 NOTE — Patient Instructions (Signed)
Third Trimester of Pregnancy The third trimester is from week 28 through week 40 (months 7 through 9). The third trimester is a time when the unborn baby (fetus) is growing rapidly. At the end of the ninth month, the fetus is about 20 inches in length and weighs 6-10 pounds. Body changes during your third trimester Your body will continue to go through many changes during pregnancy. The changes vary from woman to woman. During the third trimester:  Your weight will continue to increase. You can expect to gain 25-35 pounds (11-16 kg) by the end of the pregnancy.  You may begin to get stretch marks on your hips, abdomen, and breasts.  You may urinate more often because the fetus is moving lower into your pelvis and pressing on your bladder.  You may develop or continue to have heartburn. This is caused by increased hormones that slow down muscles in the digestive tract.  You may develop or continue to have constipation because increased hormones slow digestion and cause the muscles that push waste through your intestines to relax.  You may develop hemorrhoids. These are swollen veins (varicose veins) in the rectum that can itch or be painful.  You may develop swollen, bulging veins (varicose veins) in your legs.  You may have increased body aches in the pelvis, back, or thighs. This is due to weight gain and increased hormones that are relaxing your joints.  You may have changes in your hair. These can include thickening of your hair, rapid growth, and changes in texture. Some women also have hair loss during or after pregnancy, or hair that feels dry or thin. Your hair will most likely return to normal after your baby is born.  Your breasts will continue to grow and they will continue to become tender. A yellow fluid (colostrum) may leak from your breasts. This is the first milk you are producing for your baby.  Your belly button may stick out.  You may notice more swelling in your hands,  face, or ankles.  You may have increased tingling or numbness in your hands, arms, and legs. The skin on your belly may also feel numb.  You may feel short of breath because of your expanding uterus.  You may have more problems sleeping. This can be caused by the size of your belly, increased need to urinate, and an increase in your body's metabolism.  You may notice the fetus "dropping," or moving lower in your abdomen (lightening).  You may have increased vaginal discharge.  You may notice your joints feel loose and you may have pain around your pelvic bone.  What to expect at prenatal visits You will have prenatal exams every 2 weeks until week 36. Then you will have weekly prenatal exams. During a routine prenatal visit:  You will be weighed to make sure you and the baby are growing normally.  Your blood pressure will be taken.  Your abdomen will be measured to track your baby's growth.  The fetal heartbeat will be listened to.  Any test results from the previous visit will be discussed.  You may have a cervical check near your due date to see if your cervix has softened or thinned (effaced).  You will be tested for Group B streptococcus. This happens between 35 and 37 weeks.  Your health care provider may ask you:  What your birth plan is.  How you are feeling.  If you are feeling the baby move.  If you have had   any abnormal symptoms, such as leaking fluid, bleeding, severe headaches, or abdominal cramping.  If you are using any tobacco products, including cigarettes, chewing tobacco, and electronic cigarettes.  If you have any questions.  Other tests or screenings that may be performed during your third trimester include:  Blood tests that check for low iron levels (anemia).  Fetal testing to check the health, activity level, and growth of the fetus. Testing is done if you have certain medical conditions or if there are problems during the  pregnancy.  Nonstress test (NST). This test checks the health of your baby to make sure there are no signs of problems, such as the baby not getting enough oxygen. During this test, a belt is placed around your belly. The baby is made to move, and its heart rate is monitored during movement.  What is false labor? False labor is a condition in which you feel small, irregular tightenings of the muscles in the womb (contractions) that usually go away with rest, changing position, or drinking water. These are called Braxton Hicks contractions. Contractions may last for hours, days, or even weeks before true labor sets in. If contractions come at regular intervals, become more frequent, increase in intensity, or become painful, you should see your health care provider. What are the signs of labor?  Abdominal cramps.  Regular contractions that start at 10 minutes apart and become stronger and more frequent with time.  Contractions that start on the top of the uterus and spread down to the lower abdomen and back.  Increased pelvic pressure and dull back pain.  A watery or bloody mucus discharge that comes from the vagina.  Leaking of amniotic fluid. This is also known as your "water breaking." It could be a slow trickle or a gush. Let your health care provider know if it has a color or strange odor. If you have any of these signs, call your health care provider right away, even if it is before your due date. Follow these instructions at home: Medicines  Follow your health care provider's instructions regarding medicine use. Specific medicines may be either safe or unsafe to take during pregnancy.  Take a prenatal vitamin that contains at least 600 micrograms (mcg) of folic acid.  If you develop constipation, try taking a stool softener if your health care provider approves. Eating and drinking  Eat a balanced diet that includes fresh fruits and vegetables, whole grains, good sources of protein  such as meat, eggs, or tofu, and low-fat dairy. Your health care provider will help you determine the amount of weight gain that is right for you.  Avoid raw meat and uncooked cheese. These carry germs that can cause birth defects in the baby.  If you have low calcium intake from food, talk to your health care provider about whether you should take a daily calcium supplement.  Eat four or five small meals rather than three large meals a day.  Limit foods that are high in fat and processed sugars, such as fried and sweet foods.  To prevent constipation: ? Drink enough fluid to keep your urine clear or pale yellow. ? Eat foods that are high in fiber, such as fresh fruits and vegetables, whole grains, and beans. Activity  Exercise only as directed by your health care provider. Most women can continue their usual exercise routine during pregnancy. Try to exercise for 30 minutes at least 5 days a week. Stop exercising if you experience uterine contractions.  Avoid heavy   lifting.  Do not exercise in extreme heat or humidity, or at high altitudes.  Wear low-heel, comfortable shoes.  Practice good posture.  You may continue to have sex unless your health care provider tells you otherwise. Relieving pain and discomfort  Take frequent breaks and rest with your legs elevated if you have leg cramps or low back pain.  Take warm sitz baths to soothe any pain or discomfort caused by hemorrhoids. Use hemorrhoid cream if your health care provider approves.  Wear a good support bra to prevent discomfort from breast tenderness.  If you develop varicose veins: ? Wear support pantyhose or compression stockings as told by your healthcare provider. ? Elevate your feet for 15 minutes, 3-4 times a day. Prenatal care  Write down your questions. Take them to your prenatal visits.  Keep all your prenatal visits as told by your health care provider. This is important. Safety  Wear your seat belt at  all times when driving.  Make a list of emergency phone numbers, including numbers for family, friends, the hospital, and police and fire departments. General instructions  Avoid cat litter boxes and soil used by cats. These carry germs that can cause birth defects in the baby. If you have a cat, ask someone to clean the litter box for you.  Do not travel far distances unless it is absolutely necessary and only with the approval of your health care provider.  Do not use hot tubs, steam rooms, or saunas.  Do not drink alcohol.  Do not use any products that contain nicotine or tobacco, such as cigarettes and e-cigarettes. If you need help quitting, ask your health care provider.  Do not use any medicinal herbs or unprescribed drugs. These chemicals affect the formation and growth of the baby.  Do not douche or use tampons or scented sanitary pads.  Do not cross your legs for long periods of time.  To prepare for the arrival of your baby: ? Take prenatal classes to understand, practice, and ask questions about labor and delivery. ? Make a trial run to the hospital. ? Visit the hospital and tour the maternity area. ? Arrange for maternity or paternity leave through employers. ? Arrange for family and friends to take care of pets while you are in the hospital. ? Purchase a rear-facing car seat and make sure you know how to install it in your car. ? Pack your hospital bag. ? Prepare the baby's nursery. Make sure to remove all pillows and stuffed animals from the baby's crib to prevent suffocation.  Visit your dentist if you have not gone during your pregnancy. Use a soft toothbrush to brush your teeth and be gentle when you floss. Contact a health care provider if:  You are unsure if you are in labor or if your water has broken.  You become dizzy.  You have mild pelvic cramps, pelvic pressure, or nagging pain in your abdominal area.  You have lower back pain.  You have persistent  nausea, vomiting, or diarrhea.  You have an unusual or bad smelling vaginal discharge.  You have pain when you urinate. Get help right away if:  Your water breaks before 37 weeks.  You have regular contractions less than 5 minutes apart before 37 weeks.  You have a fever.  You are leaking fluid from your vagina.  You have spotting or bleeding from your vagina.  You have severe abdominal pain or cramping.  You have rapid weight loss or weight gain.    You have shortness of breath with chest pain.  You notice sudden or extreme swelling of your face, hands, ankles, feet, or legs.  Your baby makes fewer than 10 movements in 2 hours.  You have severe headaches that do not go away when you take medicine.  You have vision changes. Summary  The third trimester is from week 28 through week 40, months 7 through 9. The third trimester is a time when the unborn baby (fetus) is growing rapidly.  During the third trimester, your discomfort may increase as you and your baby continue to gain weight. You may have abdominal, leg, and back pain, sleeping problems, and an increased need to urinate.  During the third trimester your breasts will keep growing and they will continue to become tender. A yellow fluid (colostrum) may leak from your breasts. This is the first milk you are producing for your baby.  False labor is a condition in which you feel small, irregular tightenings of the muscles in the womb (contractions) that eventually go away. These are called Braxton Hicks contractions. Contractions may last for hours, days, or even weeks before true labor sets in.  Signs of labor can include: abdominal cramps; regular contractions that start at 10 minutes apart and become stronger and more frequent with time; watery or bloody mucus discharge that comes from the vagina; increased pelvic pressure and dull back pain; and leaking of amniotic fluid. This information is not intended to replace advice  given to you by your health care provider. Make sure you discuss any questions you have with your health care provider. Document Released: 09/23/2001 Document Revised: 03/06/2016 Document Reviewed: 11/30/2012 Elsevier Interactive Patient Education  2017 [employer]  

## 2017-05-29 LAB — RPR: RPR Ser Ql: NONREACTIVE

## 2017-05-29 LAB — CBC
HEMATOCRIT: 36.3 % (ref 34.0–46.6)
Hemoglobin: 11.9 g/dL (ref 11.1–15.9)
MCH: 33 pg (ref 26.6–33.0)
MCHC: 32.8 g/dL (ref 31.5–35.7)
MCV: 101 fL — AB (ref 79–97)
PLATELETS: 234 10*3/uL (ref 150–379)
RBC: 3.61 x10E6/uL — ABNORMAL LOW (ref 3.77–5.28)
RDW: 12.7 % (ref 12.3–15.4)
WBC: 10.1 10*3/uL (ref 3.4–10.8)

## 2017-05-29 LAB — GLUCOSE TOLERANCE, 2 HOURS W/ 1HR
GLUCOSE, 1 HOUR: 116 mg/dL (ref 65–179)
GLUCOSE, FASTING: 79 mg/dL (ref 65–91)
Glucose, 2 hour: 96 mg/dL (ref 65–152)

## 2017-05-29 LAB — HIV ANTIBODY (ROUTINE TESTING W REFLEX): HIV Screen 4th Generation wRfx: NONREACTIVE

## 2017-06-12 ENCOUNTER — Ambulatory Visit (INDEPENDENT_AMBULATORY_CARE_PROVIDER_SITE_OTHER): Payer: Medicaid Other | Admitting: Obstetrics and Gynecology

## 2017-06-12 VITALS — BP 103/65 | HR 75 | Wt 172.0 lb

## 2017-06-12 DIAGNOSIS — Z3480 Encounter for supervision of other normal pregnancy, unspecified trimester: Secondary | ICD-10-CM

## 2017-06-12 DIAGNOSIS — Z3483 Encounter for supervision of other normal pregnancy, third trimester: Secondary | ICD-10-CM

## 2017-06-12 NOTE — Progress Notes (Signed)
Prenatal Visit Note Date: 06/12/2017 Clinic: [facility]  Subjective:  Heather Gould is a 21 y.o. G2P1001 at 8936w4d being seen today for ongoing prenatal care.  She is currently monitored for the following issues for this low-risk pregnancy and has Supervision of normal pregnancy on her problem list.  Patient reports no complaints.   Contractions: Not present. Vag. Bleeding: None.  Movement: Present. Denies leaking of fluid.   The following portions of the patient's history were reviewed and updated as appropriate: allergies, current medications, past family history, past medical history, past social history, past surgical history and problem list. Problem list updated.  Objective:   Vitals:   06/12/17 0826  BP: 103/65  Pulse: 75  Weight: 172 lb (78 kg)    Fetal Status: Fetal Heart Rate (bpm): 145 Fundal Height: 30 cm Movement: Present     General:  Alert, oriented and cooperative. Patient is in no acute distress.  Skin: Skin is warm and dry. No rash noted.   Cardiovascular: Normal heart rate noted  Respiratory: Normal respiratory effort, no problems with respiration noted  Abdomen: Soft, gravid, appropriate for gestational age. Pain/Pressure: Absent     Pelvic:  Cervical exam deferred        Extremities: Normal range of motion.  Edema: Trace  Mental Status: Normal mood and affect. Normal behavior. Normal judgment and thought content.   Urinalysis:      Assessment and Plan:  Pregnancy: G2P1001 at 1936w4d  1. Supervision of other normal pregnancy, antepartum D/w pt re: BC nv.   Preterm labor symptoms and general obstetric precautions including but not limited to vaginal bleeding, contractions, leaking of fluid and fetal movement were reviewed in detail with the patient. Please refer to After Visit Summary for other counseling recommendations.  Return in about 2 weeks (around 06/26/2017) for 2-3wk rob.   [place]art BingPickens, Stpehanie Montroy, MD

## 2017-06-25 ENCOUNTER — Ambulatory Visit (INDEPENDENT_AMBULATORY_CARE_PROVIDER_SITE_OTHER): Payer: Medicaid Other | Admitting: Family Medicine

## 2017-06-25 VITALS — BP 112/70 | HR 106 | Wt 179.4 lb

## 2017-06-25 DIAGNOSIS — K219 Gastro-esophageal reflux disease without esophagitis: Secondary | ICD-10-CM

## 2017-06-25 DIAGNOSIS — Z3483 Encounter for supervision of other normal pregnancy, third trimester: Secondary | ICD-10-CM

## 2017-06-25 MED ORDER — RANITIDINE HCL 150 MG PO CAPS: 150.0000 mg | ORAL_CAPSULE | Freq: Two times a day (BID) | ORAL | 3 refills | Status: DC

## 2017-06-25 NOTE — Progress Notes (Signed)
   PRENATAL VISIT NOTE  Subjective:  Heather Gould is a 21 y.o. G2P1001 at 9138w3d being seen today for ongoing prenatal care.  She is currently monitored for the following issues for this low-risk pregnancy and has Supervision of normal pregnancy on her problem list.  Patient reports heartburn and leakage of fluid.  Contractions: Irritability. Vag. Bleeding: None.  Movement: Present. Denies leaking of fluid.   The following portions of the patient's history were reviewed and updated as appropriate: allergies, current medications, past family history, past medical history, past social history, past surgical history and problem list. Problem list updated.  Objective:   Vitals:   06/25/17 1257  BP: 112/70  Pulse: (!) 106  Weight: 179 lb 6.4 oz (81.4 kg)    Fetal Status: Fetal Heart Rate (bpm): 142 Fundal Height: 32 cm Movement: Present     General:  Alert, oriented and cooperative. Patient is in no acute distress.  Skin: Skin is warm and dry. No rash noted.   Cardiovascular: Normal heart rate noted  Respiratory: Normal respiratory effort, no problems with respiration noted  Abdomen: Soft, gravid, appropriate for gestational age.  Pain/Pressure: Present     Pelvic: Cervical exam performed neg pool, neg fern        Extremities: Normal range of motion.  Edema: Trace  Mental Status:  Normal mood and affect. Normal behavior. Normal judgment and thought content.   Assessment and Plan:  Pregnancy: G2P1001 at 5738w3d  1. Encounter for supervision of other normal pregnancy in third trimester Continue routine prenatal care. No evidence of LOF/ROM   2. Gastroesophageal reflux disease without esophagitis Trial of Zantac - ranitidine (ZANTAC) 150 MG capsule; Take 1 capsule (150 mg total) by mouth 2 (two) times daily.  Dispense: 60 capsule; Refill: 3  Preterm labor symptoms and general obstetric precautions including but not limited to vaginal bleeding, contractions, leaking of fluid and  fetal movement were reviewed in detail with the patient. Please refer to After Visit Summary for other counseling recommendations.  Return in 2 weeks (on 07/09/2017).   Reva Boresanya S Kritika Stukes, MD

## 2017-06-25 NOTE — Patient Instructions (Signed)
 Third Trimester of Pregnancy The third trimester is from week 28 through week 40 (months 7 through 9). The third trimester is a time when the unborn baby (fetus) is growing rapidly. At the end of the ninth month, the fetus is about 20 inches in length and weighs 6-10 pounds. Body changes during your third trimester Your body will continue to go through many changes during pregnancy. The changes vary from woman to woman. During the third trimester:  Your weight will continue to increase. You can expect to gain 25-35 pounds (11-16 kg) by the end of the pregnancy.  You may begin to get stretch marks on your hips, abdomen, and breasts.  You may urinate more often because the fetus is moving lower into your pelvis and pressing on your bladder.  You may develop or continue to have heartburn. This is caused by increased hormones that slow down muscles in the digestive tract.  You may develop or continue to have constipation because increased hormones slow digestion and cause the muscles that push waste through your intestines to relax.  You may develop hemorrhoids. These are swollen veins (varicose veins) in the rectum that can itch or be painful.  You may develop swollen, bulging veins (varicose veins) in your legs.  You may have increased body aches in the pelvis, back, or thighs. This is due to weight gain and increased hormones that are relaxing your joints.  You may have changes in your hair. These can include thickening of your hair, rapid growth, and changes in texture. Some women also have hair loss during or after pregnancy, or hair that feels dry or thin. Your hair will most likely return to normal after your baby is born.  Your breasts will continue to grow and they will continue to become tender. A yellow fluid (colostrum) may leak from your breasts. This is the first milk you are producing for your baby.  Your belly button may stick out.  You may notice more swelling in your  hands, face, or ankles.  You may have increased tingling or numbness in your hands, arms, and legs. The skin on your belly may also feel numb.  You may feel short of breath because of your expanding uterus.  You may have more problems sleeping. This can be caused by the size of your belly, increased need to urinate, and an increase in your body's metabolism.  You may notice the fetus "dropping," or moving lower in your abdomen (lightening).  You may have increased vaginal discharge.  You may notice your joints feel loose and you may have pain around your pelvic bone.  What to expect at prenatal visits You will have prenatal exams every 2 weeks until week 36. Then you will have weekly prenatal exams. During a routine prenatal visit:  You will be weighed to make sure you and the baby are growing normally.  Your blood pressure will be taken.  Your abdomen will be measured to track your baby's growth.  The fetal heartbeat will be listened to.  Any test results from the previous visit will be discussed.  You may have a cervical check near your due date to see if your cervix has softened or thinned (effaced).  You will be tested for Group B streptococcus. This happens between 35 and 37 weeks.  Your health care provider may ask you:  What your birth plan is.  How you are feeling.  If you are feeling the baby move.  If you have   had any abnormal symptoms, such as leaking fluid, bleeding, severe headaches, or abdominal cramping.  If you are using any tobacco products, including cigarettes, chewing tobacco, and electronic cigarettes.  If you have any questions.  Other tests or screenings that may be performed during your third trimester include:  Blood tests that check for low iron levels (anemia).  Fetal testing to check the health, activity level, and growth of the fetus. Testing is done if you have certain medical conditions or if there are problems during the  pregnancy.  Nonstress test (NST). This test checks the health of your baby to make sure there are no signs of problems, such as the baby not getting enough oxygen. During this test, a belt is placed around your belly. The baby is made to move, and its heart rate is monitored during movement.  What is false labor? False labor is a condition in which you feel small, irregular tightenings of the muscles in the womb (contractions) that usually go away with rest, changing position, or drinking water. These are called Braxton Hicks contractions. Contractions may last for hours, days, or even weeks before true labor sets in. If contractions come at regular intervals, become more frequent, increase in intensity, or become painful, you should see your health care provider. What are the signs of labor?  Abdominal cramps.  Regular contractions that start at 10 minutes apart and become stronger and more frequent with time.  Contractions that start on the top of the uterus and spread down to the lower abdomen and back.  Increased pelvic pressure and dull back pain.  A watery or bloody mucus discharge that comes from the vagina.  Leaking of amniotic fluid. This is also known as your "water breaking." It could be a slow trickle or a gush. Let your health care provider know if it has a color or strange odor. If you have any of these signs, call your health care provider right away, even if it is before your due date. Follow these instructions at home: Medicines  Follow your health care provider's instructions regarding medicine use. Specific medicines may be either safe or unsafe to take during pregnancy.  Take a prenatal vitamin that contains at least 600 micrograms (mcg) of folic acid.  If you develop constipation, try taking a stool softener if your health care provider approves. Eating and drinking  Eat a balanced diet that includes fresh fruits and vegetables, whole grains, good sources of protein  such as meat, eggs, or tofu, and low-fat dairy. Your health care provider will help you determine the amount of weight gain that is right for you.  Avoid raw meat and uncooked cheese. These carry germs that can cause birth defects in the baby.  If you have low calcium intake from food, talk to your health care provider about whether you should take a daily calcium supplement.  Eat four or five small meals rather than three large meals a day.  Limit foods that are high in fat and processed sugars, such as fried and sweet foods.  To prevent constipation: ? Drink enough fluid to keep your urine clear or pale yellow. ? Eat foods that are high in fiber, such as fresh fruits and vegetables, whole grains, and beans. Activity  Exercise only as directed by your health care provider. Most women can continue their usual exercise routine during pregnancy. Try to exercise for 30 minutes at least 5 days a week. Stop exercising if you experience uterine contractions.  Avoid   heavy lifting.  Do not exercise in extreme heat or humidity, or at high altitudes.  Wear low-heel, comfortable shoes.  Practice good posture.  You may continue to have sex unless your health care provider tells you otherwise. Relieving pain and discomfort  Take frequent breaks and rest with your legs elevated if you have leg cramps or low back pain.  Take warm sitz baths to soothe any pain or discomfort caused by hemorrhoids. Use hemorrhoid cream if your health care provider approves.  Wear a good support bra to prevent discomfort from breast tenderness.  If you develop varicose veins: ? Wear support pantyhose or compression stockings as told by your healthcare provider. ? Elevate your feet for 15 minutes, 3-4 times a day. Prenatal care  Write down your questions. Take them to your prenatal visits.  Keep all your prenatal visits as told by your health care provider. This is important. Safety  Wear your seat belt at  all times when driving.  Make a list of emergency phone numbers, including numbers for family, friends, the hospital, and police and fire departments. General instructions  Avoid cat litter boxes and soil used by cats. These carry germs that can cause birth defects in the baby. If you have a cat, ask someone to clean the litter box for you.  Do not travel far distances unless it is absolutely necessary and only with the approval of your health care provider.  Do not use hot tubs, steam rooms, or saunas.  Do not drink alcohol.  Do not use any products that contain nicotine or tobacco, such as cigarettes and e-cigarettes. If you need help quitting, ask your health care provider.  Do not use any medicinal herbs or unprescribed drugs. These chemicals affect the formation and growth of the baby.  Do not douche or use tampons or scented sanitary pads.  Do not cross your legs for long periods of time.  To prepare for the arrival of your baby: ? Take prenatal classes to understand, practice, and ask questions about labor and delivery. ? Make a trial run to the hospital. ? Visit the hospital and tour the maternity area. ? Arrange for maternity or paternity leave through employers. ? Arrange for family and friends to take care of pets while you are in the hospital. ? Purchase a rear-facing car seat and make sure you know how to install it in your car. ? Pack your hospital bag. ? Prepare the baby's nursery. Make sure to remove all pillows and stuffed animals from the baby's crib to prevent suffocation.  Visit your dentist if you have not gone during your pregnancy. Use a soft toothbrush to brush your teeth and be gentle when you floss. Contact a health care provider if:  You are unsure if you are in labor or if your water has broken.  You become dizzy.  You have mild pelvic cramps, pelvic pressure, or nagging pain in your abdominal area.  You have lower back pain.  You have persistent  nausea, vomiting, or diarrhea.  You have an unusual or bad smelling vaginal discharge.  You have pain when you urinate. Get help right away if:  Your water breaks before 37 weeks.  You have regular contractions less than 5 minutes apart before 37 weeks.  You have a fever.  You are leaking fluid from your vagina.  You have spotting or bleeding from your vagina.  You have severe abdominal pain or cramping.  You have rapid weight loss or weight   gain.  You have shortness of breath with chest pain.  You notice sudden or extreme swelling of your face, hands, ankles, feet, or legs.  Your baby makes fewer than 10 movements in 2 hours.  You have severe headaches that do not go away when you take medicine.  You have vision changes. Summary  The third trimester is from week 28 through week 40, months 7 through 9. The third trimester is a time when the unborn baby (fetus) is growing rapidly.  During the third trimester, your discomfort may increase as you and your baby continue to gain weight. You may have abdominal, leg, and back pain, sleeping problems, and an increased need to urinate.  During the third trimester your breasts will keep growing and they will continue to become tender. A yellow fluid (colostrum) may leak from your breasts. This is the first milk you are producing for your baby.  False labor is a condition in which you feel small, irregular tightenings of the muscles in the womb (contractions) that eventually go away. These are called Braxton Hicks contractions. Contractions may last for hours, days, or even weeks before true labor sets in.  Signs of labor can include: abdominal cramps; regular contractions that start at 10 minutes apart and become stronger and more frequent with time; watery or bloody mucus discharge that comes from the vagina; increased pelvic pressure and dull back pain; and leaking of amniotic fluid. This information is not intended to replace advice  given to you by your health care provider. Make sure you discuss any questions you have with your health care provider. Document Released: 09/23/2001 Document Revised: 03/06/2016 Document Reviewed: 11/30/2012 [employer] Interactive Patient Education  2017 [employer]   Breastfeeding Deciding to breastfeed is one of the best choices you can make for you and your baby. A change in hormones during pregnancy causes your breast tissue to grow and increases the number and size of your milk ducts. These hormones also allow proteins, sugars, and fats from your blood supply to make breast milk in your milk-producing glands. Hormones prevent breast milk from being released before your baby is born as well as prompt milk flow after birth. Once breastfeeding has begun, thoughts of your baby, as well as his or her sucking or crying, can stimulate the release of milk from your milk-producing glands. Benefits of breastfeeding For Your Baby  Your first milk (colostrum) helps your baby's digestive system function better.  There are antibodies in your milk that help your baby fight off infections.  Your baby has a lower incidence of asthma, allergies, and sudden infant death syndrome.  The nutrients in breast milk are better for your baby than infant formulas and are designed uniquely for your baby's needs.  Breast milk improves your baby's brain development.  Your baby is less likely to develop other conditions, such as childhood obesity, asthma, or type 2 diabetes mellitus.  For You  Breastfeeding helps to create a very special bond between you and your baby.  Breastfeeding is convenient. Breast milk is always available at the correct temperature and costs nothing.  Breastfeeding helps to burn calories and helps you lose the weight gained during pregnancy.  Breastfeeding makes your uterus contract to its prepregnancy size faster and slows bleeding (lochia) after you give birth.  Breastfeeding helps  to lower your risk of developing type 2 diabetes mellitus, osteoporosis, and breast or ovarian cancer later in life.  Signs that your baby is hungry Early Signs of Hunger    Increased alertness or activity.  Stretching.  Movement of the head from side to side.  Movement of the head and opening of the mouth when the corner of the mouth or cheek is stroked (rooting).  Increased sucking sounds, smacking lips, cooing, sighing, or squeaking.  Hand-to-mouth movements.  Increased sucking of fingers or hands.  Late Signs of Hunger  Fussing.  Intermittent crying.  Extreme Signs of Hunger Signs of extreme hunger will require calming and consoling before your baby will be able to breastfeed successfully. Do not wait for the following signs of extreme hunger to occur before you initiate breastfeeding:  Restlessness.  A loud, strong cry.  Screaming.  Breastfeeding basics Breastfeeding Initiation  Find a comfortable place to sit or lie down, with your neck and back well supported.  Place a pillow or rolled up blanket under your baby to bring him or her to the level of your breast (if you are seated). Nursing pillows are specially designed to help support your arms and your baby while you breastfeed.  Make sure that your baby's abdomen is facing your abdomen.  Gently massage your breast. With your fingertips, massage from your chest wall toward your nipple in a circular motion. This encourages milk flow. You may need to continue this action during the feeding if your milk flows slowly.  Support your breast with 4 fingers underneath and your thumb above your nipple. Make sure your fingers are well away from your nipple and your baby's mouth.  Stroke your baby's lips gently with your finger or nipple.  When your baby's mouth is open wide enough, quickly bring your baby to your breast, placing your entire nipple and as much of the colored area around your nipple (areola) as possible into  your baby's mouth. ? More areola should be visible above your baby's upper lip than below the lower lip. ? Your baby's tongue should be between his or her lower gum and your breast.  Ensure that your baby's mouth is correctly positioned around your nipple (latched). Your baby's lips should create a seal on your breast and be turned out (everted).  It is common for your baby to suck about 2-3 minutes in order to start the flow of breast milk.  Latching Teaching your baby how to latch on to your breast properly is very important. An improper latch can cause nipple pain and decreased milk supply for you and poor weight gain in your baby. Also, if your baby is not latched onto your nipple properly, he or she may swallow some air during feeding. This can make your baby fussy. Burping your baby when you switch breasts during the feeding can help to get rid of the air. However, teaching your baby to latch on properly is still the best way to prevent fussiness from swallowing air while breastfeeding. Signs that your baby has successfully latched on to your nipple:  Silent tugging or silent sucking, without causing you pain.  Swallowing heard between every 3-4 sucks.  Muscle movement above and in front of his or her ears while sucking.  Signs that your baby has not successfully latched on to nipple:  Sucking sounds or smacking sounds from your baby while breastfeeding.  Nipple pain.  If you think your baby has not latched on correctly, slip your finger into the corner of your baby's mouth to break the suction and place it between your baby's gums. Attempt breastfeeding initiation again. Signs of Successful Breastfeeding Signs from your baby:  A   gradual decrease in the number of sucks or complete cessation of sucking.  Falling asleep.  Relaxation of his or her body.  Retention of a small amount of milk in his or her mouth.  Letting go of your breast by himself or herself.  Signs from  you:  Breasts that have increased in firmness, weight, and size 1-3 hours after feeding.  Breasts that are softer immediately after breastfeeding.  Increased milk volume, as well as a change in milk consistency and color by the fifth day of breastfeeding.  Nipples that are not sore, cracked, or bleeding.  Signs That Your Baby is Getting Enough Milk  Wetting at least 1-2 diapers during the first 24 hours after birth.  Wetting at least 5-6 diapers every 24 hours for the first week after birth. The urine should be clear or pale yellow by 5 days after birth.  Wetting 6-8 diapers every 24 hours as your baby continues to grow and develop.  At least 3 stools in a 24-hour period by age 5 days. The stool should be soft and yellow.  At least 3 stools in a 24-hour period by age 7 days. The stool should be seedy and yellow.  No loss of weight greater than 10% of birth weight during the first 3 days of age.  Average weight gain of 4-7 ounces (113-198 g) per week after age 4 days.  Consistent daily weight gain by age 5 days, without weight loss after the age of 2 weeks.  After a feeding, your baby may spit up a small amount. This is common. Breastfeeding frequency and duration Frequent feeding will help you make more milk and can prevent sore nipples and breast engorgement. Breastfeed when you feel the need to reduce the fullness of your breasts or when your baby shows signs of hunger. This is called "breastfeeding on demand." Avoid introducing a pacifier to your baby while you are working to establish breastfeeding (the first 4-6 weeks after your baby is born). After this time you may choose to use a pacifier. Research has shown that pacifier use during the first year of a baby's life decreases the risk of sudden infant death syndrome (SIDS). Allow your baby to feed on each breast as long as he or she wants. Breastfeed until your baby is finished feeding. When your baby unlatches or falls asleep  while feeding from the first breast, offer the second breast. Because newborns are often sleepy in the first few weeks of life, you may need to awaken your baby to get him or her to feed. Breastfeeding times will vary from baby to baby. However, the following rules can serve as a guide to help you ensure that your baby is properly fed:  Newborns (babies 4 weeks of age or younger) may breastfeed every 1-3 hours.  Newborns should not go longer than 3 hours during the day or 5 hours during the night without breastfeeding.  You should breastfeed your baby a minimum of 8 times in a 24-hour period until you begin to introduce solid foods to your baby at around 6 months of age.  Breast milk pumping Pumping and storing breast milk allows you to ensure that your baby is exclusively fed your breast milk, even at times when you are unable to breastfeed. This is especially important if you are going back to work while you are still breastfeeding or when you are not able to be present during feedings. Your lactation consultant can give you guidelines on how   long it is safe to store breast milk. A breast pump is a machine that allows you to pump milk from your breast into a sterile bottle. The pumped breast milk can then be stored in a refrigerator or freezer. Some breast pumps are operated by hand, while others use electricity. Ask your lactation consultant which type will work best for you. Breast pumps can be purchased, but some hospitals and breastfeeding support groups lease breast pumps on a monthly basis. A lactation consultant can teach you how to hand express breast milk, if you prefer not to use a pump. Caring for your breasts while you breastfeed Nipples can become dry, cracked, and sore while breastfeeding. The following recommendations can help keep your breasts moisturized and healthy:  Avoid using soap on your nipples.  Wear a supportive bra. Although not required, special nursing bras and tank  tops are designed to allow access to your breasts for breastfeeding without taking off your entire bra or top. Avoid wearing underwire-style bras or extremely tight bras.  Air dry your nipples for 3-4minutes after each feeding.  Use only cotton bra pads to absorb leaked breast milk. Leaking of breast milk between feedings is normal.  Use lanolin on your nipples after breastfeeding. Lanolin helps to maintain your skin's normal moisture barrier. If you use pure lanolin, you do not need to wash it off before feeding your baby again. Pure lanolin is not toxic to your baby. You may also hand express a few drops of breast milk and gently massage that milk into your nipples and allow the milk to air dry.  In the first few weeks after giving birth, some women experience extremely full breasts (engorgement). Engorgement can make your breasts feel heavy, warm, and tender to the touch. Engorgement peaks within 3-5 days after you give birth. The following recommendations can help ease engorgement:  Completely empty your breasts while breastfeeding or pumping. You may want to start by applying warm, moist heat (in the shower or with warm water-soaked hand towels) just before feeding or pumping. This increases circulation and helps the milk flow. If your baby does not completely empty your breasts while breastfeeding, pump any extra milk after he or she is finished.  Wear a snug bra (nursing or regular) or tank top for 1-2 days to signal your body to slightly decrease milk production.  Apply ice packs to your breasts, unless this is too uncomfortable for you.  Make sure that your baby is latched on and positioned properly while breastfeeding.  If engorgement persists after 48 hours of following these recommendations, contact your health care provider or a lactation consultant. Overall health care recommendations while breastfeeding  Eat healthy foods. Alternate between meals and snacks, eating 3 of each per  day. Because what you eat affects your breast milk, some of the foods may make your baby more irritable than usual. Avoid eating these foods if you are sure that they are negatively affecting your baby.  Drink milk, fruit juice, and water to satisfy your thirst (about 10 glasses a day).  Rest often, relax, and continue to take your prenatal vitamins to prevent fatigue, stress, and anemia.  Continue breast self-awareness checks.  Avoid chewing and smoking tobacco. Chemicals from cigarettes that pass into breast milk and exposure to secondhand smoke may harm your baby.  Avoid alcohol and drug use, including marijuana. Some medicines that may be harmful to your baby can pass through breast milk. It is important to ask your health care   provider before taking any medicine, including all over-the-counter and prescription medicine as well as vitamin and herbal supplements. It is possible to become pregnant while breastfeeding. If birth control is desired, ask your health care provider about options that will be safe for your baby. Contact a health care provider if:  You feel like you want to stop breastfeeding or have become frustrated with breastfeeding.  You have painful breasts or nipples.  Your nipples are cracked or bleeding.  Your breasts are red, tender, or warm.  You have a swollen area on either breast.  You have a fever or chills.  You have nausea or vomiting.  You have drainage other than breast milk from your nipples.  Your breasts do not become full before feedings by the fifth day after you give birth.  You feel sad and depressed.  Your baby is too sleepy to eat well.  Your baby is having trouble sleeping.  Your baby is wetting less than 3 diapers in a 24-hour period.  Your baby has less than 3 stools in a 24-hour period.  Your baby's skin or the white part of his or her eyes becomes yellow.  Your baby is not gaining weight by 5 days of age. Get help right away  if:  Your baby is overly tired (lethargic) and does not want to wake up and feed.  Your baby develops an unexplained fever. This information is not intended to replace advice given to you by your health care provider. Make sure you discuss any questions you have with your health care provider. Document Released: 09/29/2005 Document Revised: 03/12/2016 Document Reviewed: 03/23/2013 Elsevier Interactive Patient Education  2017 [employer]  

## 2017-07-09 ENCOUNTER — Encounter: Payer: Self-pay | Admitting: Family Medicine

## 2017-07-09 ENCOUNTER — Ambulatory Visit (INDEPENDENT_AMBULATORY_CARE_PROVIDER_SITE_OTHER): Payer: Medicaid Other | Admitting: Family Medicine

## 2017-07-09 VITALS — BP 116/63 | HR 98 | Wt 180.0 lb

## 2017-07-09 DIAGNOSIS — Z3483 Encounter for supervision of other normal pregnancy, third trimester: Secondary | ICD-10-CM

## 2017-07-09 DIAGNOSIS — L309 Dermatitis, unspecified: Secondary | ICD-10-CM

## 2017-07-09 MED ORDER — TRIAMCINOLONE ACETONIDE 0.5 % EX OINT: 1.0000 "application " | TOPICAL_OINTMENT | Freq: Two times a day (BID) | CUTANEOUS | 0 refills | Status: DC

## 2017-07-09 NOTE — Progress Notes (Signed)
   PRENATAL VISIT NOTE  Subjective:  Heather Gould is a 21 y.o. G2P1001 at [redacted]w[redacted]d being seen today for ongoing prenatal care.  She is currently monitored for the following issues for this low-risk pregnancy and has Supervision of normal pregnancy on her problem list.  Patient reports no complaints.  Contractions: Irritability. Vag. Bleeding: None.  Movement: Present. Denies leaking of fluid.   The following portions of the patient's history were reviewed and updated as appropriate: allergies, current medications, past family history, past medical history, past social history, past surgical history and problem list. Problem list updated.  Objective:   Vitals:   07/09/17 1412  BP: 116/63  Pulse: 98  Weight: 180 lb (81.6 kg)    Fetal Status: Fetal Heart Rate (bpm): 150   Movement: Present     General:  Alert, oriented and cooperative. Patient is in no acute distress.  Skin: Skin is warm and dry. No rash noted.   Cardiovascular: Normal heart rate noted  Respiratory: Normal respiratory effort, no problems with respiration noted  Abdomen: Soft, gravid, appropriate for gestational age.  Pain/Pressure: Present     Pelvic: Cervical exam deferred        Extremities: Normal range of motion.  Edema: None  Mental Status:  Normal mood and affect. Normal behavior. Normal judgment and thought content.   Assessment and Plan:  Pregnancy: G2P1001 at [redacted]w[redacted]d  1. Encounter for supervision of other normal pregnancy in third trimester FHT and Fh normal  2. Eczema, unspecified type kenalog  Preterm labor symptoms and general obstetric precautions including but not limited to vaginal bleeding, contractions, leaking of fluid and fetal movement were reviewed in detail with the patient. Please refer to After Visit Summary for other counseling recommendations.  Return in about 2 weeks (around 07/23/2017) for LR OB f/u.   Levie Heritage, DO

## 2017-07-17 LAB — OB RESULTS CONSOLE GBS: STREP GROUP B AG: POSITIVE

## 2017-07-23 ENCOUNTER — Other Ambulatory Visit (HOSPITAL_COMMUNITY)
Admission: RE | Admit: 2017-07-23 | Discharge: 2017-07-23 | Disposition: A | Discharge status: Home / Self Care | Payer: Medicaid Other | Source: Ambulatory Visit | Attending: Obstetrics & Gynecology | Admitting: Obstetrics & Gynecology

## 2017-07-23 ENCOUNTER — Ambulatory Visit (INDEPENDENT_AMBULATORY_CARE_PROVIDER_SITE_OTHER): Payer: Medicaid Other | Admitting: Obstetrics & Gynecology

## 2017-07-23 ENCOUNTER — Encounter: Payer: Self-pay | Admitting: Obstetrics & Gynecology

## 2017-07-23 VITALS — BP 107/75 | HR 109 | Wt 182.0 lb

## 2017-07-23 DIAGNOSIS — Z331 Pregnant state, incidental: Secondary | ICD-10-CM

## 2017-07-23 DIAGNOSIS — Z113 Encounter for screening for infections with a predominantly sexual mode of transmission: Secondary | ICD-10-CM | POA: Diagnosis not present

## 2017-07-23 DIAGNOSIS — Z3483 Encounter for supervision of other normal pregnancy, third trimester: Secondary | ICD-10-CM | POA: Diagnosis not present

## 2017-07-23 NOTE — Patient Instructions (Signed)
Vaginal Delivery Vaginal delivery means that you will give birth by pushing your baby out of your birth canal (vagina). A team of health care providers will help you before, during, and after vaginal delivery. Birth experiences are unique for every woman and every pregnancy, and birth experiences vary depending on where you choose to give birth. What should I do to prepare for my baby's birth? Before your baby is born, it is important to talk with your health care provider about:  Your labor and delivery preferences. These may include: ? Medicines that you may be given. ? How you will manage your pain. This might include non-medical pain relief techniques or injectable pain relief such as epidural analgesia. ? How you and your baby will be monitored during labor and delivery. ? Who may be in the labor and delivery room with you. ? Your feelings about surgical delivery of your baby (cesarean delivery, or C-section) if this becomes necessary. ? Your feelings about receiving donated blood through an IV tube (blood transfusion) if this becomes necessary.  Whether you are able: ? To take pictures or videos of the birth. ? To eat during labor and delivery. ? To move around, walk, or change positions during labor and delivery.  What to expect after your baby is born, such as: ? Whether delayed umbilical cord clamping and cutting is offered. ? Who will care for your baby right after birth. ? Medicines or tests that may be recommended for your baby. ? Whether breastfeeding is supported in your hospital or birth center. ? How long you will be in the hospital or birth center.  How any medical conditions you have may affect your baby or your labor and delivery experience.  To prepare for your baby's birth, you should also:  Attend all of your health care visits before delivery (prenatal visits) as recommended by your health care provider. This is important.  Prepare your home for your baby's  arrival. Make sure that you have: ? Diapers. ? Baby clothing. ? Feeding equipment. ? Safe sleeping arrangements for you and your baby.  Install a car seat in your vehicle. Have your car seat checked by a certified car seat installer to make sure that it is installed safely.  Think about who will help you with your new baby at home for at least the first several weeks after delivery.  What can I expect when I arrive at the birth center or hospital? Once you are in labor and have been admitted into the hospital or birth center, your health care provider may:  Review your pregnancy history and any concerns you have.  Insert an IV tube into one of your veins. This is used to give you fluids and medicines.  Check your blood pressure, pulse, temperature, and heart rate (vital signs).  Check whether your bag of water (amniotic sac) has broken (ruptured).  Talk with you about your birth plan and discuss pain control options.  Monitoring Your health care provider may monitor your contractions (uterine monitoring) and your baby's heart rate (fetal monitoring). You may need to be monitored:  Often, but not continuously (intermittently).  All the time or for long periods at a time (continuously). Continuous monitoring may be needed if: ? You are taking certain medicines, such as medicine to relieve pain or make your contractions stronger. ? You have pregnancy or labor complications.  Monitoring may be done by:  Placing a special stethoscope or a handheld monitoring device on your abdomen to   check your baby's heartbeat, and feeling your abdomen for contractions. This method of monitoring does not continuously record your baby's heartbeat or your contractions.  Placing monitors on your abdomen (external monitors) to record your baby's heartbeat and the frequency and length of contractions. You may not have to wear external monitors all the time.  Placing monitors inside of your uterus  (internal monitors) to record your baby's heartbeat and the frequency, length, and strength of your contractions. ? Your health care provider may use internal monitors if he or she needs more information about the strength of your contractions or your baby's heart rate. ? Internal monitors are put in place by passing a thin, flexible wire through your vagina and into your uterus. Depending on the type of monitor, it may remain in your uterus or on your baby's head until birth. ? Your health care provider will discuss the benefits and risks of internal monitoring with you and will ask for your permission before inserting the monitors.  Telemetry. This is a type of continuous monitoring that can be done with external or internal monitors. Instead of having to stay in bed, you are able to move around during telemetry. Ask your health care provider if telemetry is an option for you.  Physical exam Your health care provider may perform a physical exam. This may include:  Checking whether your baby is positioned: ? With the head toward your vagina (head-down). This is most common. ? With the head toward the top of your uterus (head-up or breech). If your baby is in a breech position, your health care provider may try to turn your baby to a head-down position so you can deliver vaginally. If it does not seem that your baby can be born vaginally, your provider may recommend surgery to deliver your baby. In rare cases, you may be able to deliver vaginally if your baby is head-up (breech delivery). ? Lying sideways (transverse). Babies that are lying sideways cannot be delivered vaginally.  Checking your cervix to determine: ? Whether it is thinning out (effacing). ? Whether it is opening up (dilating). ? How low your baby has moved into your birth canal.  What are the three stages of labor and delivery?  Normal labor and delivery is divided into the following three stages: Stage 1  Stage 1 is the  longest stage of labor, and it can last for hours or days. Stage 1 includes: ? Early labor. This is when contractions may be irregular, or regular and mild. Generally, early labor contractions are more than 10 minutes apart. ? Active labor. This is when contractions get longer, more regular, more frequent, and more intense. ? The transition phase. This is when contractions happen very close together, are very intense, and may last longer than during any other part of labor.  Contractions generally feel mild, infrequent, and irregular at first. They get stronger, more frequent (about every 2-3 minutes), and more regular as you progress from early labor through active labor and transition.  Many women progress through stage 1 naturally, but you may need help to continue making progress. If this happens, your health care provider may talk with you about: ? Rupturing your amniotic sac if it has not ruptured yet. ? Giving you medicine to help make your contractions stronger and more frequent.  Stage 1 ends when your cervix is completely dilated to 4 inches (10 cm) and completely effaced. This happens at the end of the transition phase. Stage 2  Once   your cervix is completely effaced and dilated to 4 inches (10 cm), you may start to feel an urge to push. It is common for the body to naturally take a rest before feeling the urge to push, especially if you received an epidural or certain other pain medicines. This rest period may last for up to 1-2 hours, depending on your unique labor experience.  During stage 2, contractions are generally less painful, because pushing helps relieve contraction pain. Instead of contraction pain, you may feel stretching and burning pain, especially when the widest part of your baby's head passes through the vaginal opening (crowning).  Your health care provider will closely monitor your pushing progress and your baby's progress through the vagina during stage 2.  Your  health care provider may massage the area of skin between your vaginal opening and anus (perineum) or apply warm compresses to your perineum. This helps it stretch as the baby's head starts to crown, which can help prevent perineal tearing. ? In some cases, an incision may be made in your perineum (episiotomy) to allow the baby to pass through the vaginal opening. An episiotomy helps to make the opening of the vagina larger to allow more room for the baby to fit through.  It is very important to breathe and focus so your health care provider can control the delivery of your baby's head. Your health care provider may have you decrease the intensity of your pushing, to help prevent perineal tearing.  After delivery of your baby's head, the shoulders and the rest of the body generally deliver very quickly and without difficulty.  Once your baby is delivered, the umbilical cord may be cut right away, or this may be delayed for 1-2 minutes, depending on your baby's health. This may vary among health care providers, hospitals, and birth centers.  If you and your baby are healthy enough, your baby may be placed on your chest or abdomen to help maintain the baby's temperature and to help you bond with each other. Some mothers and babies start breastfeeding at this time. Your health care team will dry your baby and help keep your baby warm during this time.  Your baby may need immediate care if he or she: ? Showed signs of distress during labor. ? Has a medical condition. ? Was born too early (prematurely). ? Had a bowel movement before birth (meconium). ? Shows signs of difficulty transitioning from being inside the uterus to being outside of the uterus. If you are planning to breastfeed, your health care team will help you begin a feeding. Stage 3  The third stage of labor starts immediately after the birth of your baby and ends after you deliver the placenta. The placenta is an organ that develops  during pregnancy to provide oxygen and nutrients to your baby in the womb.  Delivering the placenta may require some pushing, and you may have mild contractions. Breastfeeding can stimulate contractions to help you deliver the placenta.  After the placenta is delivered, your uterus should tighten (contract) and become firm. This helps to stop bleeding in your uterus. To help your uterus contract and to control bleeding, your health care provider may: ? Give you medicine by injection, through an IV tube, by mouth, or through your rectum (rectally). ? Massage your abdomen or perform a vaginal exam to remove any blood clots that are left in your uterus. ? Empty your bladder by placing a thin, flexible tube (catheter) into your bladder. ? Encourage   you to breastfeed your baby. After labor is over, you and your baby will be monitored closely to ensure that you are both healthy until you are ready to go home. Your health care team will teach you how to care for yourself and your baby. This information is not intended to replace advice given to you by your health care provider. Make sure you discuss any questions you have with your health care provider. Document Released: 07/08/2008 Document Revised: 04/18/2016 Document Reviewed: 10/14/2015 Elsevier Interactive Patient Education  2018 [employer] Inc[employer]  

## 2017-07-23 NOTE — Progress Notes (Signed)
   PRENATAL VISIT NOTE  Subjective:  Heather Gould is a 21 y.o. G2P1001 at [redacted]w[redacted]d being seen today for ongoing prenatal care.  She is currently monitored for the following issues for this low-risk pregnancy and has Supervision of normal pregnancy on her problem list.  Patient reports occasional contractions.  Contractions: Irritability. Vag. Bleeding: None.  Movement: Present. Denies leaking of fluid.   The following portions of the patient's history were reviewed and updated as appropriate: allergies, current medications, past family history, past medical history, past social history, past surgical history and problem list. Problem list updated.  Objective:   Vitals:   07/23/17 1500  BP: 107/75  Pulse: (!) 109  Weight: 182 lb (82.6 kg)    Fetal Status: Fetal Heart Rate (bpm): 148 Fundal Height: 36 cm Movement: Present     General:  Alert, oriented and cooperative. Patient is in no acute distress.  Skin: Skin is warm and dry. No rash noted.   Cardiovascular: Normal heart rate noted  Respiratory: Normal respiratory effort, no problems with respiration noted  Abdomen: Soft, gravid, appropriate for gestational age.  Pain/Pressure: Present     Pelvic: Cervical exam performed        Extremities: Normal range of motion.  Edema: None  Mental Status:  Normal mood and affect. Normal behavior. Normal judgment and thought content.   Assessment and Plan:  Pregnancy: G2P1001 at [redacted]w[redacted]d  1. Encounter for supervision of other normal pregnancy in third trimester  - Strep Gp B NAA - Cervicovaginal ancillary only  Preterm labor symptoms and general obstetric precautions including but not limited to vaginal bleeding, contractions, leaking of fluid and fetal movement were reviewed in detail with the patient. Please refer to After Visit Summary for other counseling recommendations.  Return in about 1 week (around 07/30/2017).   Scheryl Darter, MD

## 2017-07-24 LAB — CERVICOVAGINAL ANCILLARY ONLY
Chlamydia: NEGATIVE
Neisseria Gonorrhea: NEGATIVE

## 2017-07-25 LAB — STREP GP B NAA: Strep Gp B NAA: POSITIVE — AB

## 2017-07-30 ENCOUNTER — Ambulatory Visit (INDEPENDENT_AMBULATORY_CARE_PROVIDER_SITE_OTHER): Payer: Medicaid Other | Admitting: Obstetrics & Gynecology

## 2017-07-30 VITALS — BP 119/74 | HR 94

## 2017-07-30 DIAGNOSIS — Z3483 Encounter for supervision of other normal pregnancy, third trimester: Secondary | ICD-10-CM

## 2017-07-30 NOTE — Patient Instructions (Signed)
Vaginal Delivery Vaginal delivery means that you will give birth by pushing your baby out of your birth canal (vagina). A team of health care providers will help you before, during, and after vaginal delivery. Birth experiences are unique for every woman and every pregnancy, and birth experiences vary depending on where you choose to give birth. What should I do to prepare for my baby's birth? Before your baby is born, it is important to talk with your health care provider about:  Your labor and delivery preferences. These may include: ? Medicines that you may be given. ? How you will manage your pain. This might include non-medical pain relief techniques or injectable pain relief such as epidural analgesia. ? How you and your baby will be monitored during labor and delivery. ? Who may be in the labor and delivery room with you. ? Your feelings about surgical delivery of your baby (cesarean delivery, or C-section) if this becomes necessary. ? Your feelings about receiving donated blood through an IV tube (blood transfusion) if this becomes necessary.  Whether you are able: ? To take pictures or videos of the birth. ? To eat during labor and delivery. ? To move around, walk, or change positions during labor and delivery.  What to expect after your baby is born, such as: ? Whether delayed umbilical cord clamping and cutting is offered. ? Who will care for your baby right after birth. ? Medicines or tests that may be recommended for your baby. ? Whether breastfeeding is supported in your hospital or birth center. ? How long you will be in the hospital or birth center.  How any medical conditions you have may affect your baby or your labor and delivery experience.  To prepare for your baby's birth, you should also:  Attend all of your health care visits before delivery (prenatal visits) as recommended by your health care provider. This is important.  Prepare your home for your baby's  arrival. Make sure that you have: ? Diapers. ? Baby clothing. ? Feeding equipment. ? Safe sleeping arrangements for you and your baby.  Install a car seat in your vehicle. Have your car seat checked by a certified car seat installer to make sure that it is installed safely.  Think about who will help you with your new baby at home for at least the first several weeks after delivery.  What can I expect when I arrive at the birth center or hospital? Once you are in labor and have been admitted into the hospital or birth center, your health care provider may:  Review your pregnancy history and any concerns you have.  Insert an IV tube into one of your veins. This is used to give you fluids and medicines.  Check your blood pressure, pulse, temperature, and heart rate (vital signs).  Check whether your bag of water (amniotic sac) has broken (ruptured).  Talk with you about your birth plan and discuss pain control options.  Monitoring Your health care provider may monitor your contractions (uterine monitoring) and your baby's heart rate (fetal monitoring). You may need to be monitored:  Often, but not continuously (intermittently).  All the time or for long periods at a time (continuously). Continuous monitoring may be needed if: ? You are taking certain medicines, such as medicine to relieve pain or make your contractions stronger. ? You have pregnancy or labor complications.  Monitoring may be done by:  Placing a special stethoscope or a handheld monitoring device on your abdomen to   check your baby's heartbeat, and feeling your abdomen for contractions. This method of monitoring does not continuously record your baby's heartbeat or your contractions.  Placing monitors on your abdomen (external monitors) to record your baby's heartbeat and the frequency and length of contractions. You may not have to wear external monitors all the time.  Placing monitors inside of your uterus  (internal monitors) to record your baby's heartbeat and the frequency, length, and strength of your contractions. ? Your health care provider may use internal monitors if he or she needs more information about the strength of your contractions or your baby's heart rate. ? Internal monitors are put in place by passing a thin, flexible wire through your vagina and into your uterus. Depending on the type of monitor, it may remain in your uterus or on your baby's head until birth. ? Your health care provider will discuss the benefits and risks of internal monitoring with you and will ask for your permission before inserting the monitors.  Telemetry. This is a type of continuous monitoring that can be done with external or internal monitors. Instead of having to stay in bed, you are able to move around during telemetry. Ask your health care provider if telemetry is an option for you.  Physical exam Your health care provider may perform a physical exam. This may include:  Checking whether your baby is positioned: ? With the head toward your vagina (head-down). This is most common. ? With the head toward the top of your uterus (head-up or breech). If your baby is in a breech position, your health care provider may try to turn your baby to a head-down position so you can deliver vaginally. If it does not seem that your baby can be born vaginally, your provider may recommend surgery to deliver your baby. In rare cases, you may be able to deliver vaginally if your baby is head-up (breech delivery). ? Lying sideways (transverse). Babies that are lying sideways cannot be delivered vaginally.  Checking your cervix to determine: ? Whether it is thinning out (effacing). ? Whether it is opening up (dilating). ? How low your baby has moved into your birth canal.  What are the three stages of labor and delivery?  Normal labor and delivery is divided into the following three stages: Stage 1  Stage 1 is the  longest stage of labor, and it can last for hours or days. Stage 1 includes: ? Early labor. This is when contractions may be irregular, or regular and mild. Generally, early labor contractions are more than 10 minutes apart. ? Active labor. This is when contractions get longer, more regular, more frequent, and more intense. ? The transition phase. This is when contractions happen very close together, are very intense, and may last longer than during any other part of labor.  Contractions generally feel mild, infrequent, and irregular at first. They get stronger, more frequent (about every 2-3 minutes), and more regular as you progress from early labor through active labor and transition.  Many women progress through stage 1 naturally, but you may need help to continue making progress. If this happens, your health care provider may talk with you about: ? Rupturing your amniotic sac if it has not ruptured yet. ? Giving you medicine to help make your contractions stronger and more frequent.  Stage 1 ends when your cervix is completely dilated to 4 inches (10 cm) and completely effaced. This happens at the end of the transition phase. Stage 2  Once   your cervix is completely effaced and dilated to 4 inches (10 cm), you may start to feel an urge to push. It is common for the body to naturally take a rest before feeling the urge to push, especially if you received an epidural or certain other pain medicines. This rest period may last for up to 1-2 hours, depending on your unique labor experience.  During stage 2, contractions are generally less painful, because pushing helps relieve contraction pain. Instead of contraction pain, you may feel stretching and burning pain, especially when the widest part of your baby's head passes through the vaginal opening (crowning).  Your health care provider will closely monitor your pushing progress and your baby's progress through the vagina during stage 2.  Your  health care provider may massage the area of skin between your vaginal opening and anus (perineum) or apply warm compresses to your perineum. This helps it stretch as the baby's head starts to crown, which can help prevent perineal tearing. ? In some cases, an incision may be made in your perineum (episiotomy) to allow the baby to pass through the vaginal opening. An episiotomy helps to make the opening of the vagina larger to allow more room for the baby to fit through.  It is very important to breathe and focus so your health care provider can control the delivery of your baby's head. Your health care provider may have you decrease the intensity of your pushing, to help prevent perineal tearing.  After delivery of your baby's head, the shoulders and the rest of the body generally deliver very quickly and without difficulty.  Once your baby is delivered, the umbilical cord may be cut right away, or this may be delayed for 1-2 minutes, depending on your baby's health. This may vary among health care providers, hospitals, and birth centers.  If you and your baby are healthy enough, your baby may be placed on your chest or abdomen to help maintain the baby's temperature and to help you bond with each other. Some mothers and babies start breastfeeding at this time. Your health care team will dry your baby and help keep your baby warm during this time.  Your baby may need immediate care if he or she: ? Showed signs of distress during labor. ? Has a medical condition. ? Was born too early (prematurely). ? Had a bowel movement before birth (meconium). ? Shows signs of difficulty transitioning from being inside the uterus to being outside of the uterus. If you are planning to breastfeed, your health care team will help you begin a feeding. Stage 3  The third stage of labor starts immediately after the birth of your baby and ends after you deliver the placenta. The placenta is an organ that develops  during pregnancy to provide oxygen and nutrients to your baby in the womb.  Delivering the placenta may require some pushing, and you may have mild contractions. Breastfeeding can stimulate contractions to help you deliver the placenta.  After the placenta is delivered, your uterus should tighten (contract) and become firm. This helps to stop bleeding in your uterus. To help your uterus contract and to control bleeding, your health care provider may: ? Give you medicine by injection, through an IV tube, by mouth, or through your rectum (rectally). ? Massage your abdomen or perform a vaginal exam to remove any blood clots that are left in your uterus. ? Empty your bladder by placing a thin, flexible tube (catheter) into your bladder. ? Encourage   you to breastfeed your baby. After labor is over, you and your baby will be monitored closely to ensure that you are both healthy until you are ready to go home. Your health care team will teach you how to care for yourself and your baby. This information is not intended to replace advice given to you by your health care provider. Make sure you discuss any questions you have with your health care provider. Document Released: 07/08/2008 Document Revised: 04/18/2016 Document Reviewed: 10/14/2015 Elsevier Interactive Patient Education  2018 [employer] Inc[employer]  

## 2017-07-30 NOTE — Progress Notes (Signed)
Pt reports gush of fluid

## 2017-07-30 NOTE — Progress Notes (Signed)
   PRENATAL VISIT NOTE  Subjective:  Heather Gould is a 21 y.o. G2P1001 at 4979w3d being seen today for ongoing prenatal care.  She is currently monitored for the following issues for this low-risk pregnancy and has Supervision of normal pregnancy on her problem list.  Patient reports leak fluid yesterday.  Contractions: Irritability. Vag. Bleeding: None.  Movement: Present. Denies leaking of fluid.   The following portions of the patient's history were reviewed and updated as appropriate: allergies, current medications, past family history, past medical history, past social history, past surgical history and problem list. Problem list updated.  Objective:   Vitals:   07/30/17 1439  BP: 119/74  Pulse: 94    Fetal Status: Fetal Heart Rate (bpm): 145   Movement: Present     General:  Alert, oriented and cooperative. Patient is in no acute distress.  Skin: Skin is warm and dry. No rash noted.   Cardiovascular: Normal heart rate noted  Respiratory: Normal respiratory effort, no problems with respiration noted  Abdomen: Soft, gravid, appropriate for gestational age.  Pain/Pressure: Present     Pelvic: had leakage of fluid yesterday that has stopped      no pool negative fern  Extremities: Normal range of motion.  Edema: Trace  Mental Status:  Normal mood and affect. Normal behavior. Normal judgment and thought content.   Assessment and Plan:  Pregnancy: G2P1001 at 4439w3d  1. Encounter for supervision of other normal pregnancy in third trimester No evidence of ROM with negative pool or fern today  Term labor symptoms and general obstetric precautions including but not limited to vaginal bleeding, contractions, leaking of fluid and fetal movement were reviewed in detail with the patient. Please refer to After Visit Summary for other counseling recommendations.  Return in about 1 week (around 08/06/2017).   Scheryl DarterJames Brandolyn Shortridge, MD

## 2017-08-06 ENCOUNTER — Ambulatory Visit (INDEPENDENT_AMBULATORY_CARE_PROVIDER_SITE_OTHER): Payer: Medicaid Other | Admitting: Obstetrics & Gynecology

## 2017-08-06 DIAGNOSIS — L309 Dermatitis, unspecified: Secondary | ICD-10-CM

## 2017-08-06 DIAGNOSIS — Z3483 Encounter for supervision of other normal pregnancy, third trimester: Secondary | ICD-10-CM

## 2017-08-06 MED ORDER — TRIAMCINOLONE ACETONIDE 0.5 % EX OINT: 1.0000 "application " | TOPICAL_OINTMENT | Freq: Two times a day (BID) | CUTANEOUS | 1 refills | Status: DC

## 2017-08-06 NOTE — Patient Instructions (Addendum)

## 2017-08-06 NOTE — Progress Notes (Signed)
   PRENATAL VISIT NOTE  Subjective:  Heather Gould is a 21 y.o. G2P1001 at 1172w3d being seen today for ongoing prenatal care.  She is currently monitored for the following issues for this low-risk pregnancy and has Supervision of normal pregnancy on her problem list.  Patient reports eczema outbreak on her right forearm. It is itchy. she would like some medication for it. .  Contractions: Irregular. Vag. Bleeding: None.  Movement: Present. Denies leaking of fluid.   The following portions of the patient's history were reviewed and updated as appropriate: allergies, current medications, past family history, past medical history, past social history, past surgical history and problem list. Problem list updated.  Objective:   Vitals:   08/06/17 1151  BP: 125/66  Pulse: 88  Weight: 184 lb 6.4 oz (83.6 kg)    Fetal Status: Fetal Heart Rate (bpm): 155 Fundal Height: 38 cm Movement: Present  Presentation: Vertex  General:  Alert, oriented and cooperative. Patient is in no acute distress.  Skin: Skin is warm and dry. Right forearm shows circular rash consistent with eczema.   Cardiovascular: Normal heart rate noted  Respiratory: Normal respiratory effort, no problems with respiration noted  Abdomen: Soft, gravid, appropriate for gestational age.  Pain/Pressure: Present     Pelvic: Cervical exam performed Dilation: 1.5      Extremities: Normal range of motion.  Edema: None  Mental Status:  Normal mood and affect. Normal behavior. Normal judgment and thought content.   Assessment and Plan:  Pregnancy: G2P1001 at 5272w3d  1. Encounter for supervision of other normal pregnancy in third trimester 2. RX for Kenalog renewed at pharmacy.   Term labor symptoms and general obstetric precautions including but not limited to vaginal bleeding, contractions, leaking of fluid and fetal movement were reviewed in detail with the patient. Please refer to After Visit Summary for other counseling  recommendations.  Return in about 1 week (around 08/13/2017).   Heather Gould, CNM

## 2017-08-12 ENCOUNTER — Ambulatory Visit (INDEPENDENT_AMBULATORY_CARE_PROVIDER_SITE_OTHER): Payer: Medicaid Other | Admitting: Medical

## 2017-08-12 VITALS — BP 117/78 | HR 98 | Wt 183.0 lb

## 2017-08-12 DIAGNOSIS — Z3403 Encounter for supervision of normal first pregnancy, third trimester: Secondary | ICD-10-CM

## 2017-08-12 NOTE — Progress Notes (Signed)
   PRENATAL VISIT NOTE  Subjective:  Heather AvenaMartha Gould is a 21 y.o. G2P1001 at 2558w2d being seen today for ongoing prenatal care.  She is currently monitored for the following issues for this low-risk pregnancy and has Supervision of normal pregnancy and Eczema on her problem list.  Patient reports occasional contractions and ?LOF.  Contractions: Irregular. Vag. Bleeding: None.  Movement: Present. Denies leaking of fluid.   The following portions of the patient's history were reviewed and updated as appropriate: allergies, current medications, past family history, past medical history, past social history, past surgical history and problem list. Problem list updated.  Objective:   Vitals:   08/12/17 1436  BP: 117/78  Pulse: 98  Weight: 183 lb (83 kg)    Fetal Status: Fetal Heart Rate (bpm): 130   Movement: Present  Presentation: Vertex  General:  Alert, oriented and cooperative. Patient is in no acute distress.  Skin: Skin is warm and dry. No rash noted.   Cardiovascular: Normal heart rate noted  Respiratory: Normal respiratory effort, no problems with respiration noted  Abdomen: Soft, gravid, appropriate for gestational age.  Pain/Pressure: Present     Pelvic: Cervical exam performed Dilation: 3.5 Effacement (%): 50 Station: -2  No pooling. Fern - negative  Extremities: Normal range of motion.  Edema: None  Mental Status:  Normal mood and affect. Normal behavior. Normal judgment and thought content.   Assessment and Plan:  Pregnancy: G2P1001 at 6258w2d  1. Encounter for supervision of normal first pregnancy in third trimester - Doing well - IOL scheduled for 41 weeks if not delivered  Term labor symptoms and general obstetric precautions including but not limited to vaginal bleeding, contractions, leaking of fluid and fetal movement were reviewed in detail with the patient. Please refer to After Visit Summary for other counseling recommendations.  Return in about 1 week (around  08/19/2017) for LOB, NST.   Vonzella NippleJulie Wenzel, PA-C

## 2017-08-12 NOTE — Patient Instructions (Signed)
Fetal Movement Counts °Patient Name: ________________________________________________ Patient Due Date: ____________________ °What is a fetal movement count? °A fetal movement count is the number of times that you feel your baby move during a certain amount of time. This may also be called a fetal kick count. A fetal movement count is recommended for every pregnant woman. You may be asked to start counting fetal movements as early as week 28 of your pregnancy. °Pay attention to when your baby is most active. You may notice your baby's sleep and wake cycles. You may also notice things that make your baby move more. You should do a fetal movement count: °· When your baby is normally most active. °· At the same time each day. ° °A good time to count movements is while you are resting, after having something to eat and drink. °How do I count fetal movements? °1. Find a quiet, comfortable area. Sit, or lie down on your side. °2. Write down the date, the start time and stop time, and the number of movements that you felt between those two times. Take this information with you to your health care visits. °3. For 2 hours, count kicks, flutters, swishes, rolls, and jabs. You should feel at least 10 movements during 2 hours. °4. You may stop counting after you have felt 10 movements. °5. If you do not feel 10 movements in 2 hours, have something to eat and drink. Then, keep resting and counting for 1 hour. If you feel at least 4 movements during that hour, you may stop counting. °Contact a health care provider if: °· You feel fewer than 4 movements in 2 hours. °· Your baby is not moving like he or she usually does. °Date: ____________ Start time: ____________ Stop time: ____________ Movements: ____________ °Date: ____________ Start time: ____________ Stop time: ____________ Movements: ____________ °Date: ____________ Start time: ____________ Stop time: ____________ Movements: ____________ °Date: ____________ Start time:  ____________ Stop time: ____________ Movements: ____________ °Date: ____________ Start time: ____________ Stop time: ____________ Movements: ____________ °Date: ____________ Start time: ____________ Stop time: ____________ Movements: ____________ °Date: ____________ Start time: ____________ Stop time: ____________ Movements: ____________ °Date: ____________ Start time: ____________ Stop time: ____________ Movements: ____________ °Date: ____________ Start time: ____________ Stop time: ____________ Movements: ____________ °This information is not intended to replace advice given to you by your health care provider. Make sure you discuss any questions you have with your health care provider. °Document Released: 10/29/2006 Document Revised: 05/28/2016 Document Reviewed: 11/08/2015 °Elsevier Interactive Patient Education © 2018 [employer] °Braxton Hicks Contractions °Contractions of the uterus can occur throughout pregnancy, but they are not always a sign that you are in labor. You may have practice contractions called Braxton Hicks contractions. These false labor contractions are sometimes confused with true labor. °What are Braxton Hicks contractions? °Braxton Hicks contractions are tightening movements that occur in the muscles of the uterus before labor. Unlike true labor contractions, these contractions do not result in opening (dilation) and thinning of the cervix. Toward the end of pregnancy (32-34 weeks), Braxton Hicks contractions can happen more often and may become stronger. These contractions are sometimes difficult to tell apart from true labor because they can be very uncomfortable. You should not feel embarrassed if you go to the hospital with false labor. °Sometimes, the only way to tell if you are in true labor is for your health care provider to look for changes in the cervix. The health care provider will do a physical exam and may monitor your contractions. If   you are not in true labor, the exam  should show that your cervix is not dilating and your water has not broken. °If there are no prenatal problems or other health problems associated with your pregnancy, it is completely safe for you to be sent home with false labor. You may continue to have Braxton Hicks contractions until you go into true labor. °How can I tell the difference between true labor and false labor? °· Differences °? False labor °? Contractions last 30-70 seconds.: Contractions are usually shorter and not as strong as true labor contractions. °? Contractions become very regular.: Contractions are usually irregular. °? Discomfort is usually felt in the top of the uterus, and it spreads to the lower abdomen and low back.: Contractions are often felt in the front of the lower abdomen and in the groin. °? Contractions do not go away with walking.: Contractions may go away when you walk around or change positions while lying down. °? Contractions usually become more intense and increase in frequency.: Contractions get weaker and are shorter-lasting as time goes on. °? The cervix dilates and gets thinner.: The cervix usually does not dilate or become thin. °Follow these instructions at home: °· Take over-the-counter and prescription medicines only as told by your health care provider. °· Keep up with your usual exercises and follow other instructions from your health care provider. °· Eat and drink lightly if you think you are going into labor. °· If Braxton Hicks contractions are making you uncomfortable: °? Change your position from lying down or resting to walking, or change from walking to resting. °? Sit and rest in a tub of warm water. °? Drink enough fluid to keep your urine clear or pale yellow. Dehydration may cause these contractions. °? Do slow and deep breathing several times an hour. °· Keep all follow-up prenatal visits as told by your health care provider. This is important. °Contact a health care provider if: °· You have a  fever. °· You have continuous pain in your abdomen. °Get help right away if: °· Your contractions become stronger, more regular, and closer together. °· You have fluid leaking or gushing from your vagina. °· You pass blood-tinged mucus (bloody show). °· You have bleeding from your vagina. °· You have low back pain that you never had before. °· You feel your baby’s head pushing down and causing pelvic pressure. °· Your baby is not moving inside you as much as it used to. °Summary °· Contractions that occur before labor are called Braxton Hicks contractions, false labor, or practice contractions. °· Braxton Hicks contractions are usually shorter, weaker, farther apart, and less regular than true labor contractions. True labor contractions usually become progressively stronger and regular and they become more frequent. °· Manage discomfort from Braxton Hicks contractions by changing position, resting in a warm bath, drinking plenty of water, or practicing deep breathing. °This information is not intended to replace advice given to you by your health care provider. Make sure you discuss any questions you have with your health care provider. °Document Released: 09/29/2005 Document Revised: 08/18/2016 Document Reviewed: 08/18/2016 °Elsevier Interactive Patient Education © 2017 [employer] ° °

## 2017-08-12 NOTE — Progress Notes (Addendum)
C/o clear fluid gushes since 08/10/17 about 12 o'clockish. Scheduled for IOL 08/24/17 0730.

## 2017-08-13 ENCOUNTER — Encounter (HOSPITAL_COMMUNITY): Payer: Self-pay | Admitting: *Deleted

## 2017-08-13 ENCOUNTER — Telehealth (HOSPITAL_COMMUNITY): Payer: Self-pay | Admitting: *Deleted

## 2017-08-13 NOTE — Telephone Encounter (Signed)
Preadmission screen  

## 2017-08-18 ENCOUNTER — Ambulatory Visit (INDEPENDENT_AMBULATORY_CARE_PROVIDER_SITE_OTHER): Payer: Medicaid Other | Admitting: *Deleted

## 2017-08-18 ENCOUNTER — Ambulatory Visit: Payer: Self-pay

## 2017-08-18 ENCOUNTER — Ambulatory Visit (INDEPENDENT_AMBULATORY_CARE_PROVIDER_SITE_OTHER): Payer: Medicaid Other | Admitting: Obstetrics and Gynecology

## 2017-08-18 VITALS — BP 129/74 | HR 79 | Wt 187.0 lb

## 2017-08-18 DIAGNOSIS — Z3403 Encounter for supervision of normal first pregnancy, third trimester: Secondary | ICD-10-CM

## 2017-08-18 DIAGNOSIS — O480 Post-term pregnancy: Secondary | ICD-10-CM | POA: Diagnosis not present

## 2017-08-18 NOTE — Progress Notes (Signed)

## 2017-08-18 NOTE — Progress Notes (Signed)
   PRENATAL VISIT NOTE  Subjective:  Heather Gould is a 21 y.o. G2P1001 at 6357w1d being seen today for ongoing prenatal care.  She is currently monitored for the following issues for this low-risk pregnancy and has Supervision of normal pregnancy and Eczema on their problem list.  Patient reports no complaints.  Contractions: Irritability. Vag. Bleeding: None.  Movement: Present. Denies leaking of fluid.   The following portions of the patient's history were reviewed and updated as appropriate: allergies, current medications, past family history, past medical history, past social history, past surgical history and problem list. Problem list updated.  Objective:   Vitals:   08/18/17 1419  BP: 129/74  Pulse: 79  Weight: 84.8 kg (187 lb)    Fetal Status:     Movement: Present     General:  Alert, oriented and cooperative. Patient is in no acute distress.  Skin: Skin is warm and dry. No rash noted.   Cardiovascular: Normal heart rate noted  Respiratory: Normal respiratory effort, no problems with respiration noted  Abdomen: Soft, gravid, appropriate for gestational age.  Pain/Pressure: Present     Pelvic: Cervical exam deferred        Extremities: Normal range of motion.  Edema: None  Mental Status:  Normal mood and affect. Normal behavior. Normal judgment and thought content.   Assessment and Plan:  Pregnancy: G2P1001 at 2357w1d  1. Encounter for supervision of normal first pregnancy in third trimester  - GBS positive - NST reactive today baseline 150 bpm, 15x15 accels, no decels.  - discussed induction for 11/12 @ 0730. Questions answered    Term labor symptoms and general obstetric precautions including but not limited to vaginal bleeding, contractions, leaking of fluid and fetal movement were reviewed in detail with the patient. Please refer to After Visit Summary for other counseling recommendations.  Return in about 1 week (around 08/25/2017) for 11/12 @ 0730 for induction  of labor. Venia Carbon.   Thresa Dozier, NP

## 2017-08-24 ENCOUNTER — Encounter (HOSPITAL_COMMUNITY): Payer: Self-pay | Admitting: Certified Registered Nurse Anesthetist

## 2017-08-24 ENCOUNTER — Inpatient Hospital Stay (HOSPITAL_COMMUNITY): Payer: Medicaid Other | Admitting: Anesthesiology

## 2017-08-24 ENCOUNTER — Encounter (HOSPITAL_COMMUNITY): Payer: Self-pay

## 2017-08-24 ENCOUNTER — Inpatient Hospital Stay (HOSPITAL_COMMUNITY)
Admission: RE | Admit: 2017-08-24 | Discharge: 2017-08-26 | DRG: 807 | Disposition: A | Discharge status: Home / Self Care | Payer: Medicaid Other | Source: Ambulatory Visit | Attending: Family Medicine | Admitting: Family Medicine

## 2017-08-24 VITALS — BP 110/64 | HR 77 | Temp 98.1°F | Resp 18 | Ht 63.0 in | Wt 187.0 lb

## 2017-08-24 DIAGNOSIS — L309 Dermatitis, unspecified: Secondary | ICD-10-CM | POA: Diagnosis present

## 2017-08-24 DIAGNOSIS — Z3A41 41 weeks gestation of pregnancy: Secondary | ICD-10-CM | POA: Diagnosis not present

## 2017-08-24 DIAGNOSIS — O99824 Streptococcus B carrier state complicating childbirth: Secondary | ICD-10-CM | POA: Diagnosis present

## 2017-08-24 DIAGNOSIS — O480 Post-term pregnancy: Secondary | ICD-10-CM | POA: Diagnosis not present

## 2017-08-24 LAB — TYPE AND SCREEN
ABO/RH(D): O POS
Antibody Screen: NEGATIVE

## 2017-08-24 LAB — CBC
HCT: 38 % (ref 36.0–46.0)
Hemoglobin: 12.8 g/dL (ref 12.0–15.0)
MCH: 33.3 pg (ref 26.0–34.0)
MCHC: 33.7 g/dL (ref 30.0–36.0)
MCV: 99 fL (ref 78.0–100.0)
PLATELETS: 205 10*3/uL (ref 150–400)
RBC: 3.84 MIL/uL — ABNORMAL LOW (ref 3.87–5.11)
RDW: 13.1 % (ref 11.5–15.5)
WBC: 8.1 10*3/uL (ref 4.0–10.5)

## 2017-08-24 LAB — RPR: RPR: NONREACTIVE

## 2017-08-24 MED ORDER — PENICILLIN G POTASSIUM 5000000 UNITS IJ SOLR
5.0000 10*6.[IU] | Freq: Once | INTRAMUSCULAR | Status: AC
  Administered 2017-08-24: 5 10*6.[IU] via INTRAVENOUS
  Filled 2017-08-24: qty 5

## 2017-08-24 MED ORDER — ONDANSETRON HCL 4 MG/2ML IJ SOLN: 4.0000 mg | Freq: Four times a day (QID) | INTRAMUSCULAR | Status: DC | PRN

## 2017-08-24 MED ORDER — LIDOCAINE HCL (PF) 1 % IJ SOLN
30.0000 mL | INTRAMUSCULAR | Status: DC | PRN
  Filled 2017-08-24: qty 30

## 2017-08-24 MED ORDER — MISOPROSTOL 25 MCG QUARTER TABLET
25.0000 ug | ORAL_TABLET | ORAL | Status: DC | PRN
  Filled 2017-08-24: qty 1

## 2017-08-24 MED ORDER — ACETAMINOPHEN 325 MG PO TABS: 650.0000 mg | ORAL_TABLET | ORAL | Status: DC | PRN

## 2017-08-24 MED ORDER — OXYTOCIN BOLUS FROM INFUSION: 500.0000 mL | Freq: Once | INTRAVENOUS | Status: DC

## 2017-08-24 MED ORDER — TETANUS-DIPHTH-ACELL PERTUSSIS 5-2.5-18.5 LF-MCG/0.5 IM SUSP: 0.5000 mL | Freq: Once | INTRAMUSCULAR | Status: DC

## 2017-08-24 MED ORDER — LACTATED RINGERS IV SOLN: 500.0000 mL | INTRAVENOUS | Status: DC | PRN

## 2017-08-24 MED ORDER — EPHEDRINE 5 MG/ML INJ
10.0000 mg | INTRAVENOUS | Status: DC | PRN
  Filled 2017-08-24: qty 2

## 2017-08-24 MED ORDER — EPHEDRINE 5 MG/ML INJ: 10.0000 mg | INTRAVENOUS | Status: DC | PRN

## 2017-08-24 MED ORDER — PRENATAL MULTIVITAMIN CH
1.0000 | ORAL_TABLET | Freq: Every day | ORAL | Status: DC
  Administered 2017-08-25 – 2017-08-26 (×2): 1 via ORAL
  Filled 2017-08-24 (×2): qty 1

## 2017-08-24 MED ORDER — ONDANSETRON HCL 4 MG PO TABS: 4.0000 mg | ORAL_TABLET | ORAL | Status: DC | PRN

## 2017-08-24 MED ORDER — LACTATED RINGERS IV SOLN: INTRAVENOUS | Status: DC

## 2017-08-24 MED ORDER — FLEET ENEMA 7-19 GM/118ML RE ENEM: 1.0000 | ENEMA | RECTAL | Status: DC | PRN

## 2017-08-24 MED ORDER — BENZOCAINE-MENTHOL 20-0.5 % EX AERO: 1.0000 "application " | INHALATION_SPRAY | CUTANEOUS | Status: DC | PRN

## 2017-08-24 MED ORDER — PENICILLIN G POT IN DEXTROSE 60000 UNIT/ML IV SOLN: 3.0000 10*6.[IU] | INTRAVENOUS | Status: DC

## 2017-08-24 MED ORDER — OXYTOCIN BOLUS FROM INFUSION
500.0000 mL | Freq: Once | INTRAVENOUS | Status: AC
  Administered 2017-08-24: 500 mL via INTRAVENOUS

## 2017-08-24 MED ORDER — COCONUT OIL OIL
1.0000 "application " | TOPICAL_OIL | Status: DC | PRN
  Administered 2017-08-25: 1 via TOPICAL
  Filled 2017-08-24: qty 120

## 2017-08-24 MED ORDER — IBUPROFEN 600 MG PO TABS
600.0000 mg | ORAL_TABLET | Freq: Four times a day (QID) | ORAL | Status: DC
  Administered 2017-08-25 – 2017-08-26 (×7): 600 mg via ORAL
  Filled 2017-08-24 (×6): qty 1

## 2017-08-24 MED ORDER — OXYTOCIN 40 UNITS IN LACTATED RINGERS INFUSION - SIMPLE MED: 2.5000 [IU]/h | INTRAVENOUS | Status: DC

## 2017-08-24 MED ORDER — ZOLPIDEM TARTRATE 5 MG PO TABS: 5.0000 mg | ORAL_TABLET | Freq: Every evening | ORAL | Status: DC | PRN

## 2017-08-24 MED ORDER — PHENYLEPHRINE 40 MCG/ML (10ML) SYRINGE FOR IV PUSH (FOR BLOOD PRESSURE SUPPORT): 80.0000 ug | PREFILLED_SYRINGE | INTRAVENOUS | Status: DC | PRN

## 2017-08-24 MED ORDER — ONDANSETRON HCL 4 MG/2ML IJ SOLN: 4.0000 mg | INTRAMUSCULAR | Status: DC | PRN

## 2017-08-24 MED ORDER — LACTATED RINGERS IV SOLN: 500.0000 mL | Freq: Once | INTRAVENOUS | Status: DC

## 2017-08-24 MED ORDER — OXYCODONE-ACETAMINOPHEN 5-325 MG PO TABS: 1.0000 | ORAL_TABLET | ORAL | Status: DC | PRN

## 2017-08-24 MED ORDER — PHENYLEPHRINE 40 MCG/ML (10ML) SYRINGE FOR IV PUSH (FOR BLOOD PRESSURE SUPPORT)
80.0000 ug | PREFILLED_SYRINGE | INTRAVENOUS | Status: DC | PRN
  Filled 2017-08-24: qty 5

## 2017-08-24 MED ORDER — DIBUCAINE 1 % RE OINT: 1.0000 "application " | TOPICAL_OINTMENT | RECTAL | Status: DC | PRN

## 2017-08-24 MED ORDER — OXYCODONE-ACETAMINOPHEN 5-325 MG PO TABS: 2.0000 | ORAL_TABLET | ORAL | Status: DC | PRN

## 2017-08-24 MED ORDER — FENTANYL CITRATE (PF) 100 MCG/2ML IJ SOLN
100.0000 ug | INTRAMUSCULAR | Status: DC | PRN
  Administered 2017-08-24: 100 ug via INTRAVENOUS
  Filled 2017-08-24: qty 2

## 2017-08-24 MED ORDER — ONDANSETRON HCL 4 MG/2ML IJ SOLN
4.0000 mg | Freq: Four times a day (QID) | INTRAMUSCULAR | Status: DC | PRN
  Administered 2017-08-24: 4 mg via INTRAVENOUS
  Filled 2017-08-24: qty 2

## 2017-08-24 MED ORDER — LIDOCAINE HCL (PF) 1 % IJ SOLN: 30.0000 mL | INTRAMUSCULAR | Status: DC | PRN

## 2017-08-24 MED ORDER — LIDOCAINE HCL (PF) 1 % IJ SOLN
INTRAMUSCULAR | Status: DC | PRN
  Administered 2017-08-24: 4 mL via EPIDURAL
  Administered 2017-08-24: 6 mL via EPIDURAL

## 2017-08-24 MED ORDER — DIPHENHYDRAMINE HCL 25 MG PO CAPS: 25.0000 mg | ORAL_CAPSULE | Freq: Four times a day (QID) | ORAL | Status: DC | PRN

## 2017-08-24 MED ORDER — PHENYLEPHRINE 40 MCG/ML (10ML) SYRINGE FOR IV PUSH (FOR BLOOD PRESSURE SUPPORT)
80.0000 ug | PREFILLED_SYRINGE | INTRAVENOUS | Status: DC | PRN
  Filled 2017-08-24: qty 10
  Filled 2017-08-24: qty 5

## 2017-08-24 MED ORDER — EPHEDRINE 5 MG/ML INJ
10.0000 mg | INTRAVENOUS | Status: DC | PRN
Start: 2017-08-24 — End: 2017-08-24

## 2017-08-24 MED ORDER — SENNOSIDES-DOCUSATE SODIUM 8.6-50 MG PO TABS
2.0000 | ORAL_TABLET | ORAL | Status: DC
  Administered 2017-08-25 – 2017-08-26 (×2): 2 via ORAL
  Filled 2017-08-24 (×2): qty 2

## 2017-08-24 MED ORDER — PENICILLIN G POTASSIUM 5000000 UNITS IJ SOLR: 5.0000 10*6.[IU] | Freq: Once | INTRAVENOUS | Status: DC

## 2017-08-24 MED ORDER — PENICILLIN G POT IN DEXTROSE 60000 UNIT/ML IV SOLN
3.0000 10*6.[IU] | INTRAVENOUS | Status: DC
  Administered 2017-08-24: 3 10*6.[IU] via INTRAVENOUS
  Filled 2017-08-24 (×10): qty 50

## 2017-08-24 MED ORDER — FENTANYL 2.5 MCG/ML BUPIVACAINE 1/10 % EPIDURAL INFUSION (WH - ANES)
14.0000 mL/h | INTRAMUSCULAR | Status: DC | PRN
Start: 2017-08-24 — End: 2017-08-25
  Administered 2017-08-24: 12 mL/h via EPIDURAL
  Filled 2017-08-24: qty 100

## 2017-08-24 MED ORDER — WITCH HAZEL-GLYCERIN EX PADS: 1.0000 "application " | MEDICATED_PAD | CUTANEOUS | Status: DC | PRN

## 2017-08-24 MED ORDER — LACTATED RINGERS IV SOLN
INTRAVENOUS | Status: DC
  Administered 2017-08-24: 08:00:00 via INTRAVENOUS

## 2017-08-24 MED ORDER — ACETAMINOPHEN 325 MG PO TABS
650.0000 mg | ORAL_TABLET | ORAL | Status: DC | PRN
  Administered 2017-08-25: 650 mg via ORAL
  Filled 2017-08-24: qty 2

## 2017-08-24 MED ORDER — DIPHENHYDRAMINE HCL 50 MG/ML IJ SOLN: 12.5000 mg | INTRAMUSCULAR | Status: DC | PRN

## 2017-08-24 MED ORDER — SOD CITRATE-CITRIC ACID 500-334 MG/5ML PO SOLN: 30.0000 mL | ORAL | Status: DC | PRN

## 2017-08-24 MED ORDER — SOD CITRATE-CITRIC ACID 500-334 MG/5ML PO SOLN
30.0000 mL | ORAL | Status: DC | PRN
  Administered 2017-08-24: 30 mL via ORAL
  Filled 2017-08-24: qty 15

## 2017-08-24 MED ORDER — OXYTOCIN 40 UNITS IN LACTATED RINGERS INFUSION - SIMPLE MED
1.0000 m[IU]/min | INTRAVENOUS | Status: DC
  Administered 2017-08-24: 2 m[IU]/min via INTRAVENOUS
  Filled 2017-08-24: qty 1000

## 2017-08-24 MED ORDER — TERBUTALINE SULFATE 1 MG/ML IJ SOLN
0.2500 mg | Freq: Once | INTRAMUSCULAR | Status: DC | PRN
  Filled 2017-08-24: qty 1

## 2017-08-24 MED ORDER — SIMETHICONE 80 MG PO CHEW: 80.0000 mg | CHEWABLE_TABLET | ORAL | Status: DC | PRN

## 2017-08-24 NOTE — Anesthesia Procedure Notes (Signed)
Epidural Patient location during procedure: OB Start time: 08/24/2017 1:50 PM End time: 08/24/2017 2:22 PM  Staffing Anesthesiologist: Jairo BenJackson, Ayrianna Mcginniss, MD Performed: anesthesiologist   Preanesthetic Checklist Completed: patient identified, surgical consent, pre-op evaluation, timeout performed, IV checked, risks and benefits discussed and monitors and equipment checked  Epidural Patient position: sitting Prep: site prepped and draped and DuraPrep Patient monitoring: blood pressure, continuous pulse ox and heart rate Approach: midline Location: L3-L4 Injection technique: LOR air  Needle:  Needle type: Tuohy  Needle gauge: 17 G Needle length: 9 cm Needle insertion depth: 5.5 cm Catheter type: closed end flexible Catheter size: 19 Gauge Catheter at skin depth: 10.5 cm Test dose: negative (1% Lidocaine)  Assessment Events: blood not aspirated, injection not painful, no injection resistance, negative IV test and no paresthesia  Additional Notes Pt identified in Labor room.  Monitors applied. Working IV access confirmed. Sterile prep, drape lumbar spine.  1% lido local L 2,3.  #17ga Touhy os, repeat local L 3,4 and LOR air at 5.5 cm L 3,4, cath in easily to 10.5 cm skin. Test dose OK, cath dosed and infusion begun.  Patient asymptomatic, VSS, no heme aspirated, tolerated well.  Sandford Craze Emarie Paul, MDReason for block:procedure for pain

## 2017-08-24 NOTE — H&P (Signed)
OBSTETRIC ADMISSION HISTORY AND PHYSICAL  Heather AvenaMartha Gould is a 21 y.o. female G2P1001 with IUP at 5730w0d by US (01/09/17) presenting for IOL for Post-dates. She reports +FMs, No LOF, no VB, no blurry vision, headaches or peripheral edema, and RUQ pain.  She plans on breast and bottle feeding. She is undecided, considering the pill for birth control. She received her prenatal care at [facility]   Dating: By US (01/09/17) --->  Estimated Date of Delivery: 08/17/17  Sono:  04/02/17  @[redacted]w[redacted]d , CWD, normal anatomy, cephalic presentation,  352g, 78%47% EFW  Prenatal History/Complications: -Post -dates pregnancy -low-risk  Past Medical History: Past Medical History:  Diagnosis Date  . Asthma   . Medical history non-contributory     Past Surgical History: Past Surgical History:  Procedure Laterality Date  . NO PAST SURGERIES      Obstetrical History: OB History    Gravida Para Term Preterm AB Living   2 1 1     1    SAB TAB Ectopic Multiple Live Births         0 1      Social History: Social History   Socioeconomic History  . Marital status: Single    Spouse name: None  . Number of children: None  . Years of education: None  . Highest education level: None  Social Needs  . Financial resource strain: None  . Food insecurity - worry: None  . Food insecurity - inability: None  . Transportation needs - medical: None  . Transportation needs - non-medical: None  Occupational History  . None  Tobacco Use  . Smoking status: Never Smoker  . Smokeless tobacco: Never Used  Substance and Sexual Activity  . Alcohol use: No  . Drug use: No  . Sexual activity: Yes    Birth control/protection: None  Other Topics Concern  . None  Social History Narrative  . None    Family History: Family History  Problem Relation Age of Onset  . Asthma Sister   . Asthma Brother     Allergies: No Known Allergies  Medications Prior to Admission  Medication Sig Dispense Refill Last Dose  .  Prenatal Vit-Fe Fumarate-FA (MULTIVITAMIN-PRENATAL) 27-0.8 MG TABS tablet Take 1 tablet by mouth daily at 12 noon.   Taking  . ranitidine (ZANTAC) 150 MG capsule Take 1 capsule (150 mg total) by mouth 2 (two) times daily. 60 capsule 3 Taking  . triamcinolone ointment (KENALOG) 0.5 % Apply 1 application topically 2 (two) times daily. 30 g 1 Taking     Review of Systems   All systems reviewed and negative except as stated in HPI  Blood pressure 125/77, pulse 86, temperature (!) 97.2 F (36.2 C), temperature source Oral, resp. rate 18, height 5\' 3"  (1.6 m), weight 187 lb (84.8 kg), last menstrual period 10/17/2016. General appearance: alert, cooperative and appears stated age Lungs: clear to auscultation bilaterally Heart: regular rate and rhythm Abdomen: soft, non-tender; bowel sounds normal Extremities: Homans sign is negative, no sign of DVT Presentation: cephalic Fetal monitoringBaseline: 140 bpm, Variability: Good {> 6 bpm) and Accelerations: Reactive Uterine activityNone Dilation: 4.5 Effacement (%): 80 Station: -2 Exam by:: Valentina Lucks. Woods, RN  Prenatal labs: ABO, Rh: O/Positive/-- (03/26 1534) Antibody: Negative (03/26 1534) Rubella: 2.48 (03/26 1534) RPR: Non Reactive (08/16 1013)  HBsAg: Negative (03/26 1534)  HIV:   Non-Reactive GBS: Positive (10/11 1536)  2 hr Glucola  Fasting 79/ 1 hr 116/ 2 hr 96 Genetic screening Quad neg Anatomy US normal  boy  Prenatal Transfer Tool  Maternal Diabetes: No Genetic Screening: Normal Maternal Ultrasounds/Referrals: Normal Fetal Ultrasounds or other Referrals:  None Maternal Substance Abuse:  No Significant Maternal Medications:  None Significant Maternal Lab Results: Lab values include: Group B Strep positive  Results for orders placed or performed during the hospital encounter of 08/24/17 (from the past 24 hour(s))  CBC   Collection Time: 08/24/17  8:25 AM  Result Value Ref Range   WBC 8.1 4.0 - 10.5 K/uL   RBC 3.84 (L) 3.87 -  5.11 MIL/uL   Hemoglobin 12.8 12.0 - 15.0 g/dL   HCT 16.138.0 09.636.0 - 04.546.0 %   MCV 99.0 78.0 - 100.0 fL   MCH 33.3 26.0 - 34.0 pg   MCHC 33.7 30.0 - 36.0 g/dL   RDW 40.913.1 81.111.5 - 91.415.5 %   Platelets 205 150 - 400 K/uL    Patient Active Problem List   Diagnosis Date Noted  . Post-dates pregnancy 08/24/2017  . Eczema 08/06/2017  . Supervision of normal pregnancy 01/05/2017    Assessment/Plan:  Heather Gould is a 21 y.o. G2P1001 at 6856w0d here for IOL for Post-dates.  #Labor: Admit to birthing suites for induction. Start pitocin and consider AROM if needed. #Pain: per patient request #FWB: Cat 1 #ID:  GBS pos-PCN #MOF: breast and bottle #MOC:undecided, considering pills would like additional information. Has had Nexplanon before (had minor complications with it. [place] #Circ:  Yes (out)  Suella BroadKeriann S Minott, MD  08/24/2017, 9:20 AM PGY-1  OB FELLOW HISTORY AND PHYSICAL ATTESTATION  I confirm that I have verified the information documented in the resident's note and that I have also personally reperformed the physical exam and all medical decision making activities. I agree with above documentation and have made edits as needed.   Heather AdaJazma Phelps, DO OB Fellow 08/24/2017, 11:07 AM

## 2017-08-24 NOTE — Anesthesia Preprocedure Evaluation (Signed)
Anesthesia Evaluation  Patient identified by MRN, date of birth, ID band Patient awake    Reviewed: Allergy & Precautions, NPO status , Patient's Chart, lab work & pertinent test results  History of Anesthesia Complications Negative for: history of anesthetic complications  Airway Mallampati: III  TM Distance: >3 FB Neck ROM: Full    Dental  (+) Dental Advisory Given   Pulmonary neg pulmonary ROS,    breath sounds clear to auscultation       Cardiovascular negative cardio ROS   Rhythm:Regular Rate:Normal     Neuro/Psych negative neurological ROS     GI/Hepatic negative GI ROS, Neg liver ROS,   Endo/Other  negative endocrine ROS  Renal/GU      Musculoskeletal   Abdominal   Peds  Hematology negative hematology ROS (+) plt 205k   Anesthesia Other Findings   Reproductive/Obstetrics (+) Pregnancy                             Anesthesia Physical Anesthesia Plan  ASA: II  Anesthesia Plan: Epidural   Post-op Pain Management:    Induction:   PONV Risk Score and Plan: 2 and Treatment may vary due to age or medical condition  Airway Management Planned: Natural Airway  Additional Equipment:   Intra-op Plan:   Post-operative Plan:   Informed Consent: I have reviewed the patients History and Physical, chart, labs and discussed the procedure including the risks, benefits and alternatives for the proposed anesthesia with the patient or authorized representative who has indicated his/her understanding and acceptance.   Dental advisory given  Plan Discussed with:   Anesthesia Plan Comments: (Patient identified. Risks/Benefits/Options discussed with patient including but not limited to bleeding, infection, nerve damage, paralysis, failed block, incomplete pain control, headache, blood pressure changes, nausea, vomiting, reactions to medication both or allergic, itching and postpartum back  pain. Confirmed with bedside nurse the patient's most recent platelet count. Confirmed with patient that they are not currently taking any anticoagulation, have any bleeding history or any family history of bleeding disorders. Patient expressed understanding and wished to proceed. All questions were answered. )        Anesthesia Quick Evaluation

## 2017-08-24 NOTE — Progress Notes (Addendum)
Labor Progress Note [name] is a 21 y.o. G2P1001 at 6149w0d presented for IOL for post dates S: Patient received epidural and is comfortable in bed. She feels her contractions and pressure in bottom but is not uncomfortable. Denies headaches, visual changes and RUQ pain.  O:  BP 117/67   Pulse 86   Temp 98.6 F (37 C) (Oral)   Resp 16   Ht 5\' 3"  (1.6 m)   Wt 187 lb (84.8 kg)   LMP 10/17/2016 (Within Days)   SpO2 100%   BMI 33.13 kg/m  EFM: 130/mod vari/reactive  CVE: Dilation: 9 Effacement (%): 100 Station: -1 Presentation: Vertex Exam by:: minnott   A&P: 21 y.o. G2P1001 5849w0d IOL for post dates. #Labor: Progressing well. Continue with Pitocin. AROM@1612  minimal fluid clear.Marland Kitchen. #Pain: epidural #FWB: cat 1 #GBS positive-PCN  Suella BroadKeriann S Isaid Salvia, MD  PGY-1 4:14 PM

## 2017-08-24 NOTE — Anesthesia Pain Management Evaluation Note (Signed)
  [facility] Pain Management Visit Note  Patient: Heather AvenaMartha Gould, 21 y.o., female  "Hello I am a [facility] of the [facility]. We have an anesthesia team available at all times to provide care throughout the hospital, including epidural management and anesthesia for C-section. I don't know your plan for the delivery whether it a natural birth, water birth, IV sedation, nitrous supplementation, doula or epidural, but we want to meet your pain goals."   1.Was your pain managed to your expectations on prior hospitalizations?   Yes   2.What is your expectation for pain management during this hospitalization?     Epidural  3.How can we help you reach that goal? Epidural when ready.  Record the patient's initial score and the patient's pain goal.   Pain: 0  Pain Goal: 3 [facility] you to be able to say your pain was always managed very well.  Khelani Kops L 08/24/2017

## 2017-08-25 LAB — BIRTH TISSUE RECOVERY COLLECTION (PLACENTA DONATION)

## 2017-08-25 NOTE — Lactation Note (Signed)
This note was copied from a baby's chart. Lactation Consultation Note  Patient Name: Heather Ernie AvenaMartha Rueb [id number]'s Date: 08/25/2017 Reason for consult: Initial assessment;Difficult latch  Baby 21 hours old. Nicki Guadalajararicia, RN reported that mom asking for assistance with latch, but then quickly giving a bottle. RN also discussed supplementation guidelines. Mom reports that she doesn't really like nursing, but will feel guilty if she does not attempt. Mom states that she does not think she will nurse at home, but wants everyone to keep offering assistance while she is in the hospital. Baby given 20 ml at last feeding, but mom requesting review of supplementation amounts--mom given guidelines with review. Enc mom to call out for assistance with latching when baby cueing to nurse--if mom wanting assistance.   Mom requested assistance with use of manual pump--and it was given. Mom used manual pump for a few seconds and stated that she did not think she was getting enough EBM. Enc mom to continue to pump for a few minutes on first breast, then alternate between breast. After 1 minute of pumping, EBM starting to collect in the bottle. Discussed with mom that she can latch baby and then supplement with EBM in place of formula, or if she does not want to nurse she could give EBM with syringe or bottle. Enc mom to call for assistance as needed. Discussed assessment and interventions with Nicki Guadalajararicia, RN.    Maternal Data Has patient been taught Hand Expression?: Yes Does the patient have breastfeeding experience prior to this delivery?: Yes  Feeding Feeding Type: Formula Nipple Type: Regular Length of feed: 5 min  LATCH Score                   Interventions Interventions: Hand pump  Lactation Tools Discussed/Used Tools: Pump Breast pump type: Manual   Consult Status Consult Status: Follow-up Date: 08/26/17 Follow-up type: In-patient    Heather Gould 08/25/2017, 2:57 PM

## 2017-08-25 NOTE — Anesthesia Postprocedure Evaluation (Signed)
Anesthesia Post Note  Patient: Heather AvenaMartha Gould  Procedure(s) Performed: AN AD HOC LABOR EPIDURAL     Patient location during evaluation: Mother Baby Anesthesia Type: Epidural Level of consciousness: awake Pain management: pain level controlled Vital Signs Assessment: post-procedure vital signs reviewed and stable Respiratory status: spontaneous breathing Cardiovascular status: stable Postop Assessment: no headache, epidural receding and patient able to bend at knees Anesthetic complications: no    Last Vitals:  Vitals:   08/25/17 0012 08/25/17 0510  BP: 118/70 114/63  Pulse: 96 77  Resp: 18 16  Temp: 36.5 C 36.8 C  SpO2: 99% 99%    Last Pain:  Vitals:   08/25/17 0512  TempSrc:   PainSc: 8    Pain Goal:                 Heather Gould,Heather Gould

## 2017-08-25 NOTE — Progress Notes (Signed)
POSTPARTUM PROGRESS NOTE  Post Partum Day 1  Subjective:  Ernie AvenaMartha Komatsu is a 21 y.o. [id number] s/p NSVD at 6034w0d.  No acute events overnight.  Pt denies problems with ambulating, voiding or po intake.  She denies nausea or vomiting.  Pain is well controlled.  She has had flatus. She has not had bowel movement.  Lochia Small.   Objective: Blood pressure 114/63, pulse 77, temperature 98.2 F (36.8 C), temperature source Oral, resp. rate 16, height 5\' 3"  (1.6 m), weight 84.8 kg (187 lb), last menstrual period 10/17/2016, SpO2 99 %.  Physical Exam:  General: alert, cooperative and no distress Chest: no respiratory distress Heart:regular rate, distal pulses intact Abdomen: soft, nontender,  Uterine Fundus: firm, appropriately tender DVT Evaluation: No calf swelling or tenderness Extremities: no edema   Recent Labs    08/24/17 0825  HGB 12.8  HCT 38.0    Assessment/Plan: Ernie AvenaMartha Everman is a 21 y.o. [id number] s/p NSVD at 3534w0d .  PPD#1 - Doing well. Contraception: Either Nexplanone or OCPs Feeding: Breast and bottle Dispo: Plan for discharge later today.   LOS: 1 day   Sanjuana LettersMohamed Jalia Zuniga  Medical Student 08/25/2017, 6:30 AM

## 2017-08-26 MED ORDER — SENNOSIDES-DOCUSATE SODIUM 8.6-50 MG PO TABS: 2.0000 | ORAL_TABLET | Freq: Every evening | ORAL | 0 refills | Status: DC | PRN

## 2017-08-26 MED ORDER — IBUPROFEN 600 MG PO TABS: 600.0000 mg | ORAL_TABLET | Freq: Four times a day (QID) | ORAL | 1 refills | Status: DC | PRN

## 2017-08-26 NOTE — Discharge Summary (Signed)
OB Discharge Summary     Patient Name: Heather AvenaMartha Gould DOB: 07-19-1996 MRN: [id number]  Date of admission: 08/24/2017 Delivering MD: Suella BroadMINOTT, KERIANN S   Date of discharge: 08/26/2017  Admitting diagnosis: INDUCTION Intrauterine pregnancy: 423w0d     Secondary diagnosis:  Active Problems:   Post-dates pregnancy  Additional problems: none     Discharge diagnosis: Term Pregnancy Delivered                                                                                                Post partum procedures:none  Augmentation: AROM and Pitocin  Complications: None  Hospital course:  Induction of Labor With Vaginal Delivery   21 y.o. yo [id number] at 5223w0d was admitted to the hospital 08/24/2017 for induction of labor.  Indication for induction: Postdates.  Patient had an uncomplicated labor course as follows: Membrane Rupture Time/Date: 4:12 PM ,08/24/2017   Intrapartum Procedures: Episiotomy: None [1]                                         Lacerations:  None [1]  Patient had delivery of a Viable infant.  Information for the patient's newborn:  Latanya PresserFerretiz, Boy Lyrical [[id number]][[id number]]  Delivery Method: Vaginal, Spontaneous(Filed from Delivery Summary)   08/24/2017  Details of delivery can be found in separate delivery note.  Patient had a routine postpartum course. Patient is discharged home 08/26/17.  Physical exam  Vitals:   08/25/17 0510 08/25/17 0810 08/25/17 1908 08/26/17 0536  BP: 114/63 (!) 102/51 125/72 110/64  Pulse: 77 72 97 77  Resp: 16 20 18    Temp: 98.2 F (36.8 C) 98.5 F (36.9 C) 98.1 F (36.7 C)   TempSrc: Oral Oral Oral   SpO2: 99%  99%   Weight:      Height:       General: alert, cooperative and no distress Lochia: appropriate Uterine Fundus: firm Incision: N/A DVT Evaluation: No evidence of DVT seen on physical exam. No cords or calf tenderness. Labs: Lab Results  Component Value Date   WBC 8.1 08/24/2017   HGB 12.8 08/24/2017   HCT 38.0 08/24/2017    MCV 99.0 08/24/2017   PLT 205 08/24/2017   No flowsheet data found.  Discharge instruction: per After Visit Summary and "Baby and Me Booklet".  After visit meds:  Allergies as of 08/26/2017   No Known Allergies     Medication List    TAKE these medications   ibuprofen 600 MG tablet Commonly known as:  ADVIL,MOTRIN Take 1 tablet (600 mg total) every 6 (six) hours as needed by mouth.   multivitamin-prenatal 27-0.8 MG Tabs tablet Take 1 tablet by mouth daily at 12 noon.   ranitidine 150 MG capsule Commonly known as:  ZANTAC Take 1 capsule (150 mg total) by mouth 2 (two) times daily.   senna-docusate 8.6-50 MG tablet Commonly known as:  Senokot-S Take 2 tablets at bedtime as needed by mouth for mild constipation.   triamcinolone ointment 0.5 % Commonly known as:  KENALOG Apply 1 application topically 2 (two) times daily.       Diet: routine diet  Activity: Advance as tolerated. Pelvic rest for 6 weeks.   Outpatient follow up:4 weeks Follow up Appt:No future appointments. Follow up Visit:No Follow-up on file.  Postpartum contraception: Nexplanon and vs POPs  Newborn Data: Live born female  Birth Weight: 7 lb 15 oz (3600 g) APGAR: 9, 9  Newborn Delivery   Birth date/time:  08/24/2017 17:36:00 Delivery type:  Vaginal, Spontaneous     Baby Feeding: Bottle and Breast Disposition:home with mother   08/26/2017 Heather Harmanimothy Reed Dady, DO

## 2017-08-26 NOTE — Discharge Instructions (Signed)
Postpartum Care After Vaginal Delivery °The period of time right after you deliver your newborn is called the postpartum period. °What kind of medical care will I receive? °· You may continue to receive fluids and medicines through an IV tube inserted into one of your veins. °· If an incision was made near your vagina (episiotomy) or if you had some vaginal tearing during delivery, cold compresses may be placed on your episiotomy or your tear. This helps to reduce pain and swelling. °· You may be given a squirt bottle to use when you go to the bathroom. You may use this until you are comfortable wiping as usual. To use the squirt bottle, follow these steps: °? Before you urinate, fill the squirt bottle with warm water. Do not use hot water. °? After you urinate, while you are sitting on the toilet, use the squirt bottle to rinse the area around your urethra and vaginal opening. This rinses away any urine and blood. °? You may do this instead of wiping. As you start healing, you may use the squirt bottle before wiping yourself. Make sure to wipe gently. °? Fill the squirt bottle with clean water every time you use the bathroom. °· You will be given sanitary pads to wear. °How can I expect to feel? °· You may not feel the need to urinate for several hours after delivery. °· You will have some soreness and pain in your abdomen and vagina. °· If you are breastfeeding, you may have uterine contractions every time you breastfeed for up to several weeks postpartum. Uterine contractions help your uterus return to its normal size. °· It is normal to have vaginal bleeding (lochia) after delivery. The amount and appearance of lochia is often similar to a menstrual period in the first week after delivery. It will gradually decrease over the next few weeks to a dry, yellow-brown discharge. For most women, lochia stops completely by 6-8 weeks after delivery. Vaginal bleeding can vary from woman to woman. °· Within the first few  days after delivery, you may have breast engorgement. This is when your breasts feel heavy, full, and uncomfortable. Your breasts may also throb and feel hard, tightly stretched, warm, and tender. After this occurs, you may have milk leaking from your breasts. Your health care provider can help you relieve discomfort due to breast engorgement. Breast engorgement should go away within a few days. °· You may feel more sad or worried than normal due to hormonal changes after delivery. These feelings should not last more than a few days. If these feelings do not go away after several days, speak with your health care provider. °How should I care for myself? °· Tell your health care provider if you have pain or discomfort. °· Drink enough water to keep your urine clear or pale yellow. °· Wash your hands thoroughly with soap and water for at least 20 seconds after changing your sanitary pads, after using the toilet, and before holding or feeding your baby. °· If you are not breastfeeding, avoid touching your breasts a lot. Doing this can make your breasts produce more milk. °· If you become weak or lightheaded, or you feel like you might faint, ask for help before: °? Getting out of bed. °? Showering. °· Change your sanitary pads frequently. Watch for any changes in your flow, such as a sudden increase in volume, a change in color, the passing of large blood clots. If you pass a blood clot from your vagina, save it   to show to your health care provider. Do not flush blood clots down the toilet without having your health care provider look at them. °· Make sure that all your vaccinations are up to date. This can help protect you and your baby from getting certain diseases. You may need to have immunizations done before you leave the hospital. °· If desired, talk with your health care provider about methods of family planning or birth control (contraception). °How can I start bonding with my baby? °Spending as much time as  possible with your baby is very important. During this time, you and your baby can get to know each other and develop a bond. Having your baby stay with you in your room (rooming in) can give you time to get to know your baby. Rooming in can also help you become comfortable caring for your baby. Breastfeeding can also help you bond with your baby. °How can I plan for returning home with my baby? °· Make sure that you have a car seat installed in your vehicle. °? Your car seat should be checked by a certified car seat installer to make sure that it is installed safely. °? Make sure that your baby fits into the car seat safely. °· Ask your health care provider any questions you have about caring for yourself or your baby. Make sure that you are able to contact your health care provider with any questions after leaving the hospital. °This information is not intended to replace advice given to you by your health care provider. Make sure you discuss any questions you have with your health care provider. °Document Released: 07/27/2007 Document Revised: 03/03/2016 Document Reviewed: 09/03/2015 °Elsevier Interactive Patient Education © 2018 [employer] ° °

## 2017-10-02 ENCOUNTER — Ambulatory Visit: Payer: Medicaid Other | Admitting: Obstetrics and Gynecology

## 2018-03-01 IMAGING — US US OB TRANSVAGINAL
1 series · 15 of 28 positions shown · non-contrast
Comparison: None.

CLINICAL DATA: Pregnant, for viability

EXAM:
OBSTETRIC <14 WK US AND TRANSVAGINAL OB US
TECHNIQUE: Both transabdominal and transvaginal ultrasound examinations were
performed for complete evaluation of the gestation as well as the
maternal uterus, adnexal regions, and pelvic cul-de-sac.
Transvaginal technique was performed to assess early pregnancy.

[Series 1: us ob transvaginal · 15 of 77 slices shown]
[im 1/77]
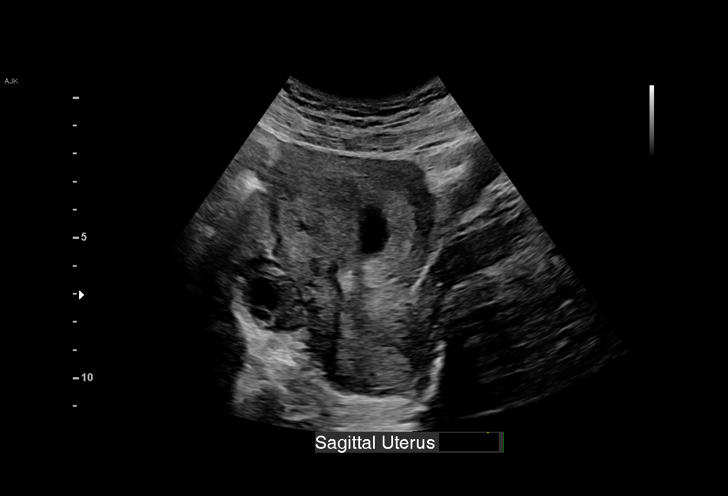
[im 6/77]
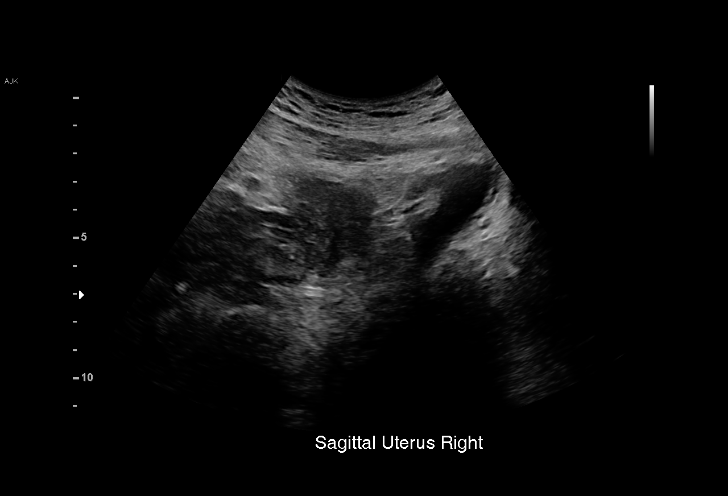
[im 12/77]
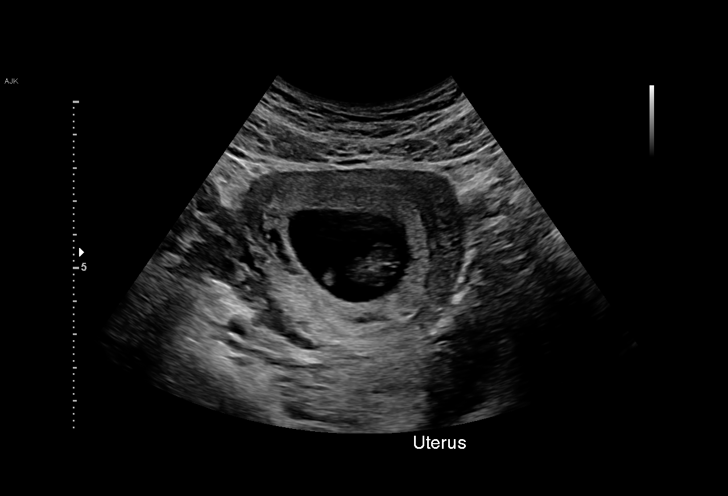
[im 17/77]
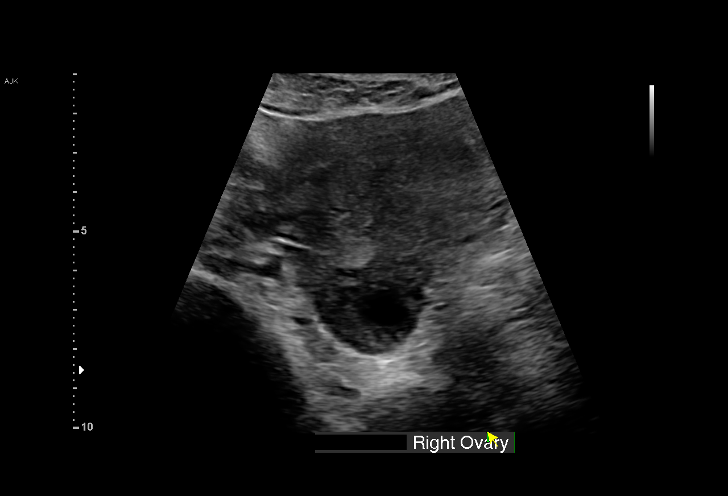
[im 23/77]
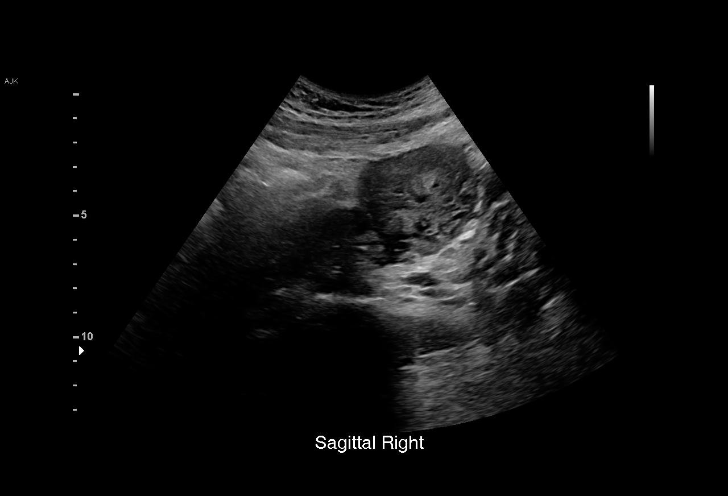
[im 29/77]
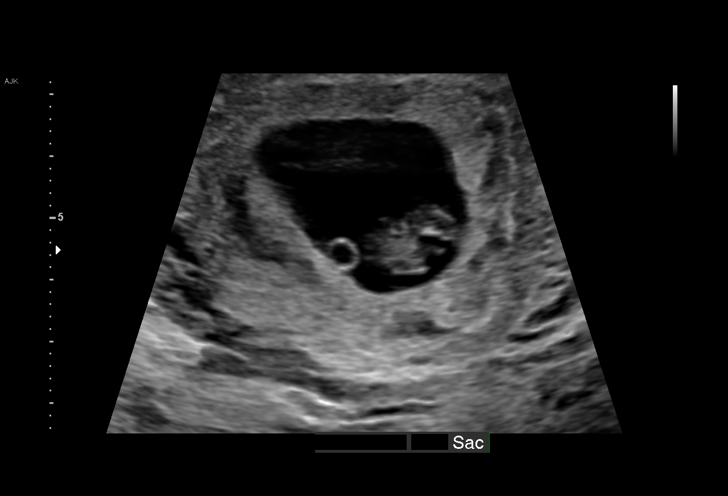
[im 34/77]
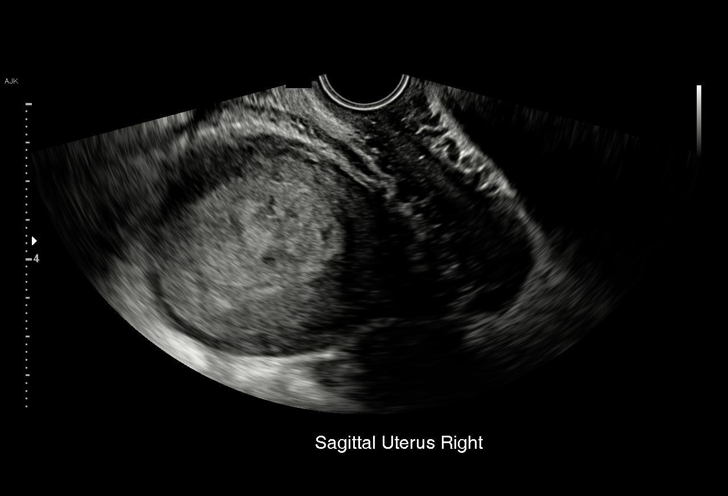
[im 40/77]
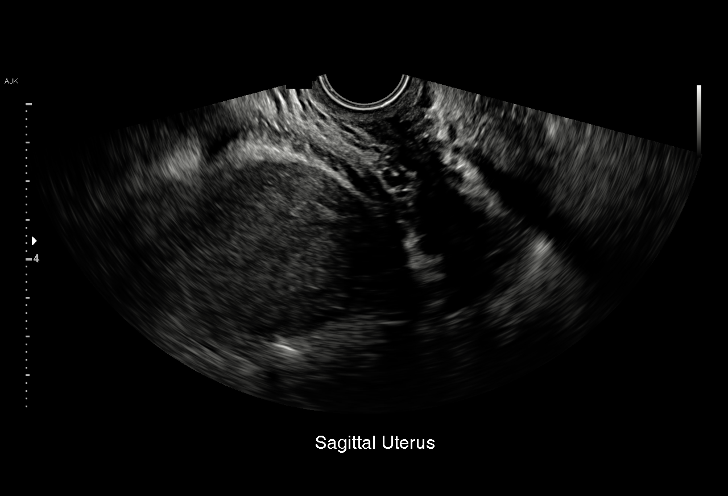
[im 43/77]
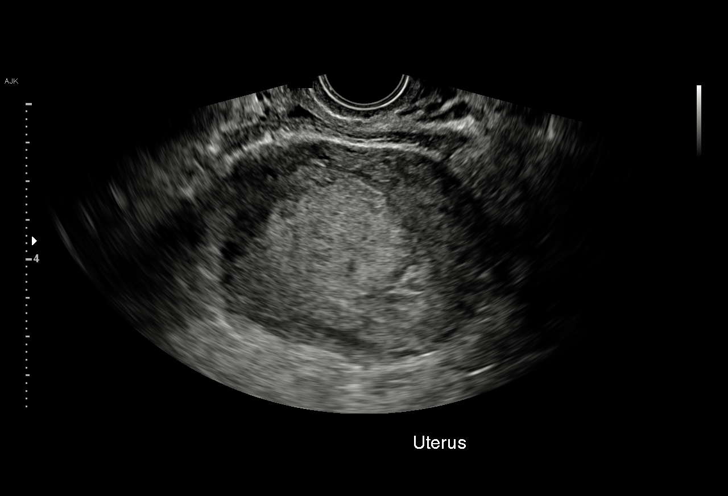
[im 48/77]
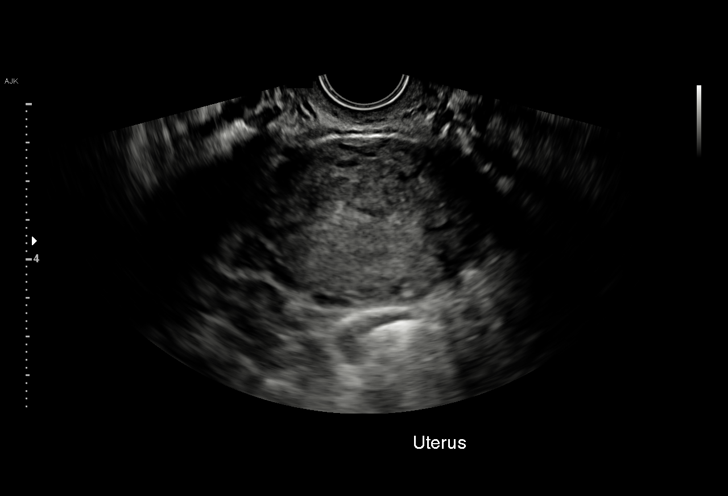
[im 54/77]
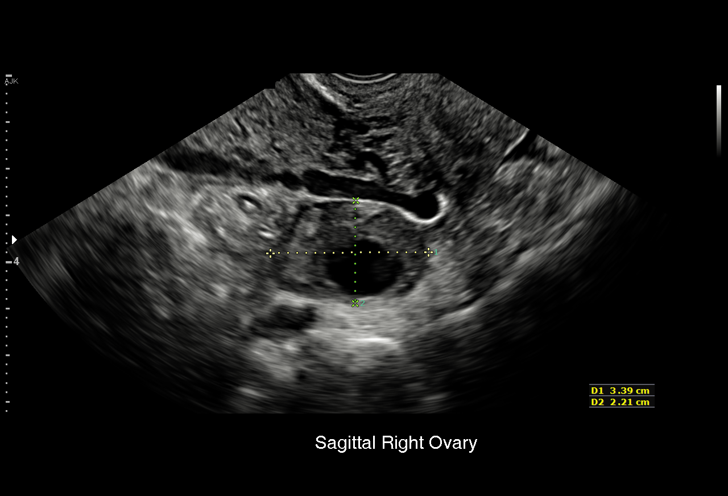
[im 60/77]
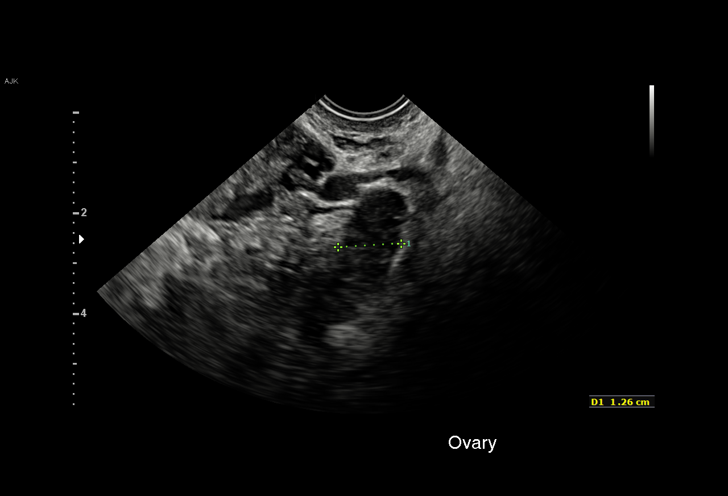
[im 65/77]
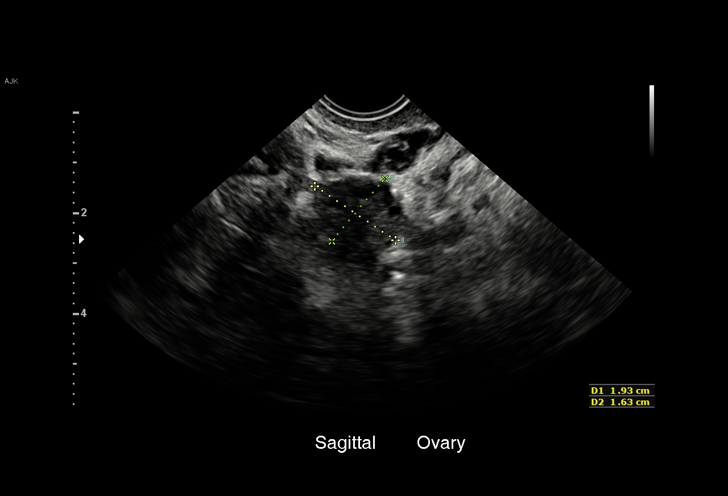
[im 71/77]
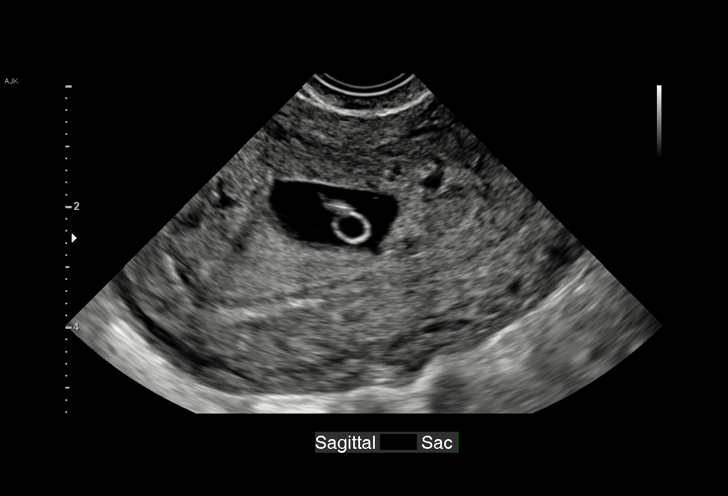
[im 77/77]
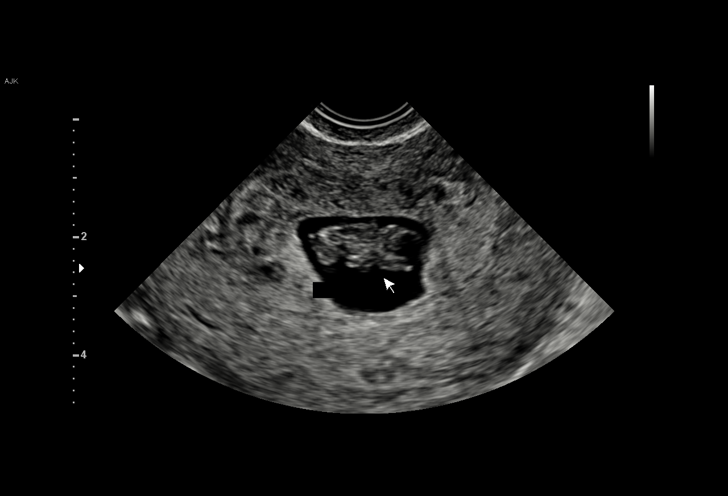

[15 of 28 positions shown; findings below may reference images not displayed]

FINDINGS: Intrauterine gestational sac: Single

Yolk sac:  Visualized.

Embryo:  Visualized.

Cardiac Activity: Visualized.

Heart Rate: 185  bpm

CRL:  20.8  mm   8 w   4 d                  US EDC: 08/17/2017

Subchorionic hemorrhage:  None visualized.

Maternal uterus/adnexae: Bilateral ovaries are within normal limits,
noting a right corpus luteal cyst.

No free fluid.
IMPRESSION: Single live intrauterine gestation, with estimated gestational age 8
weeks 4 days by crown-rump length.

## 2018-03-11 ENCOUNTER — Other Ambulatory Visit: Payer: Self-pay

## 2018-03-11 ENCOUNTER — Encounter (HOSPITAL_COMMUNITY): Payer: Self-pay

## 2018-03-11 ENCOUNTER — Emergency Department (HOSPITAL_COMMUNITY)
Admission: EM | Admit: 2018-03-11 | Discharge: 2018-03-11 | Disposition: A | Discharge status: Home / Self Care | Payer: Medicaid Other | Attending: Emergency Medicine | Admitting: Emergency Medicine

## 2018-03-11 DIAGNOSIS — Z79899 Other long term (current) drug therapy: Secondary | ICD-10-CM | POA: Diagnosis not present

## 2018-03-11 DIAGNOSIS — H9201 Otalgia, right ear: Secondary | ICD-10-CM | POA: Diagnosis present

## 2018-03-11 DIAGNOSIS — J45909 Unspecified asthma, uncomplicated: Secondary | ICD-10-CM | POA: Insufficient documentation

## 2018-03-11 DIAGNOSIS — J029 Acute pharyngitis, unspecified: Secondary | ICD-10-CM | POA: Diagnosis not present

## 2018-03-11 LAB — GROUP A STREP BY PCR: GROUP A STREP BY PCR: NOT DETECTED

## 2018-03-11 NOTE — ED Provider Notes (Addendum)
[facility] EMERGENCY DEPARTMENT Provider Note   CSN: [id number] Arrival date & time: 03/11/18  1725     History   Chief Complaint Chief Complaint  Patient presents with  . Otalgia  . Sore Throat    HPI Heather Gould is a 22 y.o. female.  HPI   22 year old female presents today with complaints of right ear pain and sore throat.  Patient notes 2 days ago she developed pain in her right ear and sore throat.  She notes this is worse with swallowing, worse with coughing although she denies any significant productive cough.  She denies any fever chills nausea or vomiting.  Patient denies any runny nose, nasal congestion or history of allergies.  Patient reports that her son is sick right now with an ear infection.  She notes that the symptoms are baseline throughout the day and not worsened at any certain time.  Past Medical History:  Diagnosis Date  . Asthma   . Medical history non-contributory     Patient Active Problem List   Diagnosis Date Noted  . Post-dates pregnancy 08/24/2017  . Eczema 08/06/2017  . Supervision of normal pregnancy 01/05/2017    Past Surgical History:  Procedure Laterality Date  . NO PAST SURGERIES       OB History    Gravida  2   Para  2   Term  2   Preterm      AB      Living  2     SAB      TAB      Ectopic      Multiple  0   Live Births  2            Home Medications    Prior to Admission medications   Medication Sig Start Date End Date Taking? Authorizing Provider  ibuprofen (ADVIL,MOTRIN) 600 MG tablet Take 1 tablet (600 mg total) every 6 (six) hours as needed by mouth. 08/26/17   Lockamy, Timothy, DO  Prenatal Vit-Fe Fumarate-FA (MULTIVITAMIN-PRENATAL) 27-0.8 MG TABS tablet Take 1 tablet by mouth daily at 12 noon.    [provider]  ranitidine (ZANTAC) 150 MG capsule Take 1 capsule (150 mg total) by mouth 2 (two) times daily. 06/25/17   Reva Bores, MD  senna-docusate (SENOKOT-S)  8.6-50 MG tablet Take 2 tablets at bedtime as needed by mouth for mild constipation. 08/26/17   Arlyce Harman, DO  triamcinolone ointment (KENALOG) 0.5 % Apply 1 application topically 2 (two) times daily. Patient not taking: Reported on 08/24/2017 08/06/17   Marylene Land, CNM    Family History Family History  Problem Relation Age of Onset  . Asthma Sister   . Asthma Brother     Social History Social History   Tobacco Use  . Smoking status: Never Smoker  . Smokeless tobacco: Never Used  Substance Use Topics  . Alcohol use: No  . Drug use: No     Allergies   Patient has no known allergies.   Review of Systems Review of Systems  All other systems reviewed and are negative.    Physical Exam Updated Vital Signs BP (!) 134/91 (BP Location: Right Arm)   Pulse 96   Temp 99.8 F (37.7 C) (Oral)   Resp 18   Ht  (1.6 m)   Wt 72.6 kg (160 lb)   SpO2 99%   Breastfeeding? Yes   BMI 28.34 kg/m   Physical Exam  Constitutional: She is  oriented to person, place, and time. She appears well-developed and well-nourished.  HENT:  Head: Normocephalic and atraumatic.  Mouth/Throat: Mucous membranes are normal. Tonsils are 0 on the right. Tonsils are 0 on the left. No tonsillar exudate.  No swelling, edema, erythema, tonsils equal bilateral with no exudate, uvula midline is with phonation, no pooling of secretions --bilateral TMs normal  Eyes: Pupils are equal, round, and reactive to light. Conjunctivae are normal. Right eye exhibits no discharge. Left eye exhibits no discharge. No scleral icterus.  Neck: Normal range of motion. No JVD present. No tracheal deviation present.  Pulmonary/Chest: Effort normal and breath sounds normal. No stridor. No respiratory distress. She has no wheezes. She has no rales. She exhibits no tenderness.  Neurological: She is alert and oriented to person, place, and time. Coordination normal.  Psychiatric: She has a normal mood and  affect. Her behavior is normal. Judgment and thought content normal.  Nursing note and vitals reviewed.    ED Treatments / Results  Labs (all labs ordered are listed, but only abnormal results are displayed) Labs Reviewed  GROUP A STREP BY PCR    EKG None  Radiology No results found.  Procedures Procedures (including critical care time)  Medications Ordered in ED Medications - No data to display   Initial Impression / Assessment and Plan / ED Course  I have reviewed the triage vital signs and the nursing notes.  Pertinent labs & imaging results that were available during my care of the patient were reviewed by me and considered in my medical decision making (see chart for details).     Group A strep by PCR negative  22 year old female presents today with complaints of sore throat and ear pain.  She has very reassuring exam with no signs of infectious etiology.  Uncertain etiology of sore throat this time.  Patient given strict return precautions, follow-up information.  She verbalized understanding and agreement to today's plan had no further questions or concerns  Final Clinical Impressions(s) / ED Diagnoses   Final diagnoses:  Sore throat    ED Discharge Orders    None       Rosalio Loud 03/11/18 1919    Eyvonne Mechanic, PA-C 03/11/18 1919    Pricilla Loveless, MD 03/17/18 [phone]

## 2018-03-11 NOTE — Discharge Instructions (Signed)
Please read attached information. If you experience any new or worsening signs or symptoms please return to the emergency room for evaluation. Please follow-up with your primary care provider or specialist as discussed. Please use medication prescribed only as directed and discontinue taking if you have any concerning signs or symptoms.   °

## 2018-03-11 NOTE — ED Triage Notes (Signed)
Pt states that she has had left ear pain and a sore throat X2 days.

## 2018-03-11 NOTE — ED Notes (Signed)
See EDP assessment 

## 2018-05-23 IMAGING — US US MFM OB COMP +14 WKS
1 series · 14 of 28 positions shown · non-contrast
Comparison: none

[Series 1: us mfm ob comp +14 wks · 63 acquisitions, 14 frames shown]
[im 3/63]
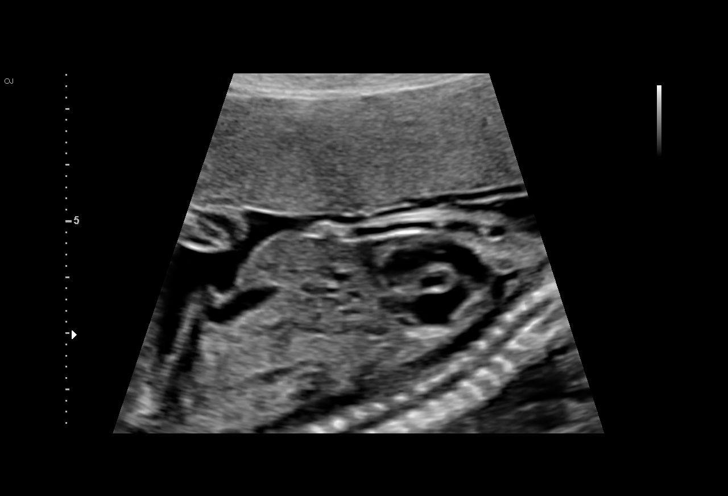
[im 7/63]
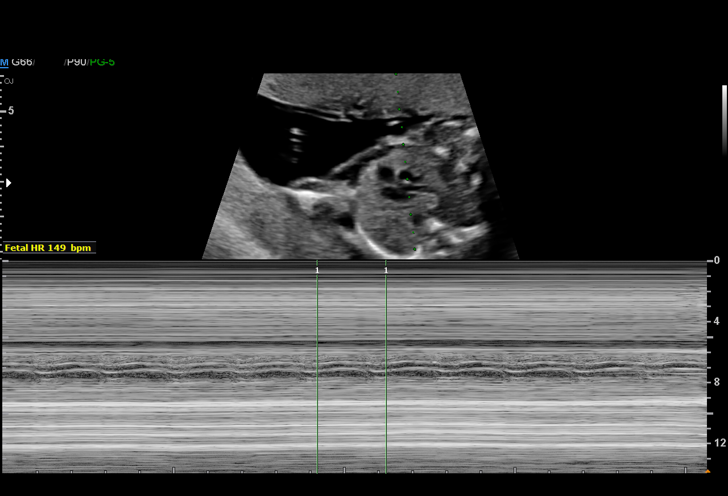
[im 12/63]
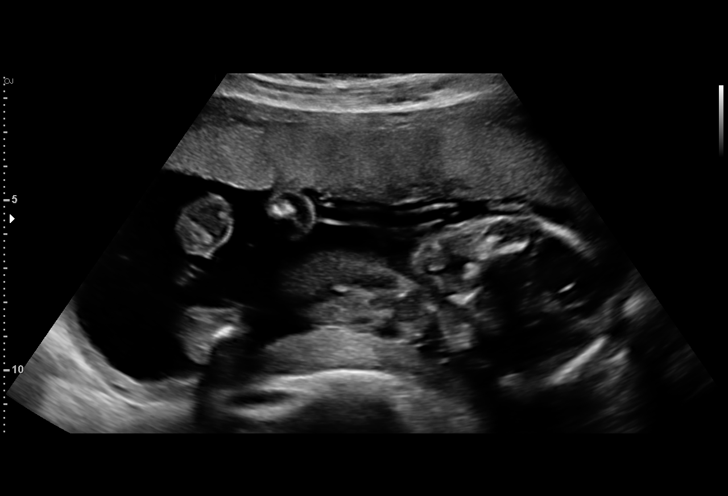
[im 17/63]
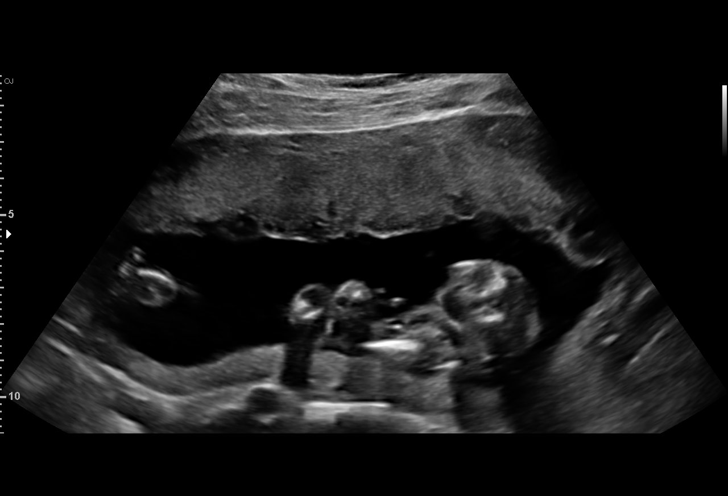
[im 21/63]
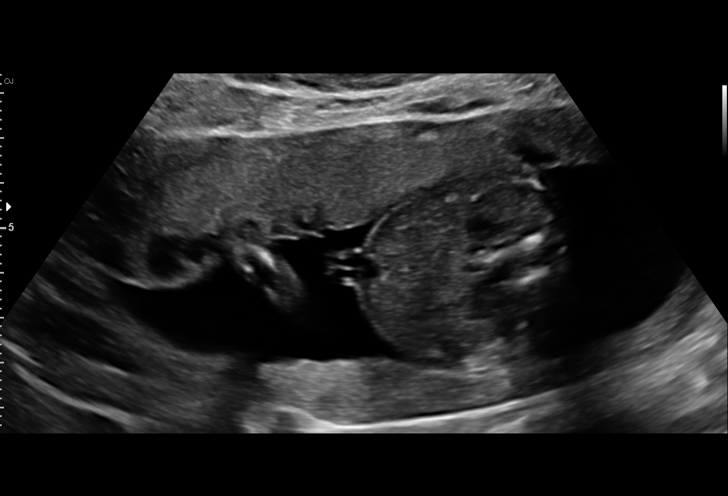
[im 26/63]
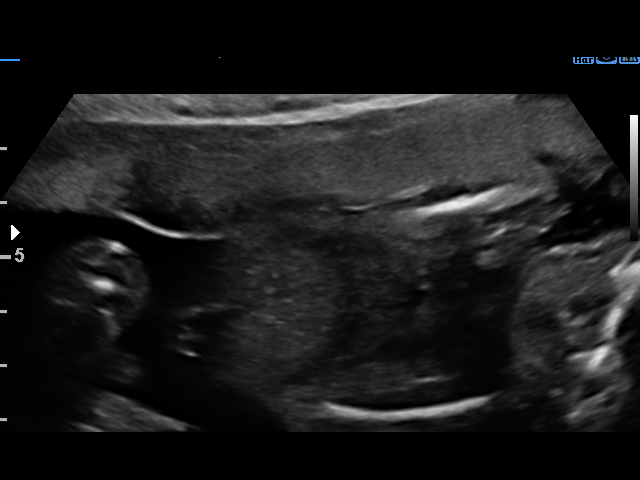
[im 30/63]
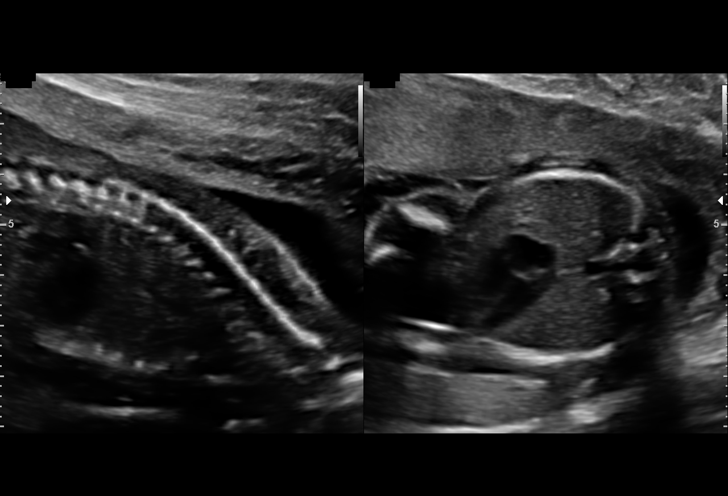
[im 35/63]
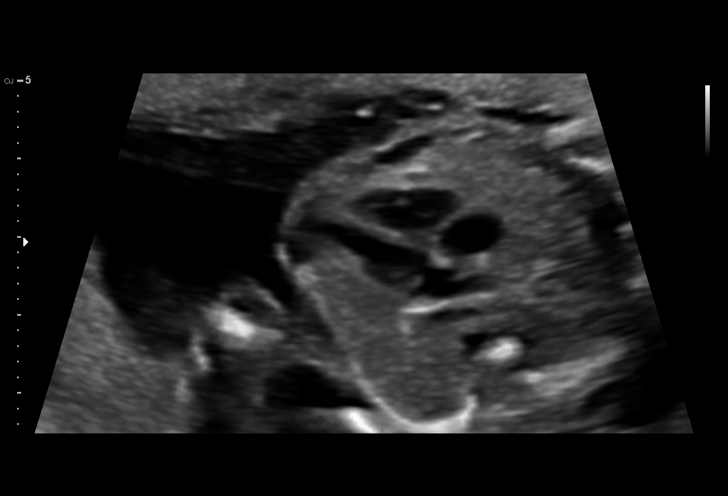
[im 40/63]
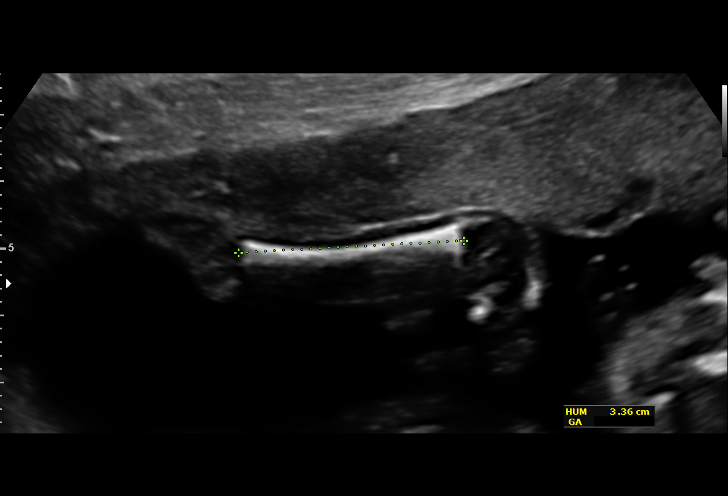
[im 44/63]
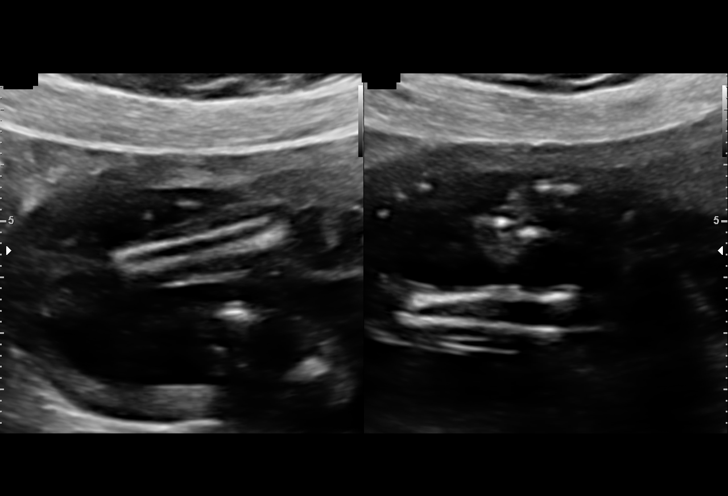
[im 49/63]
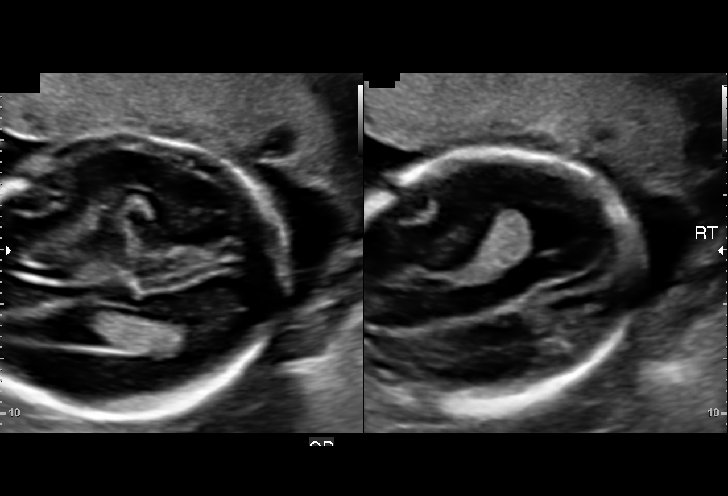
[im 53/63]
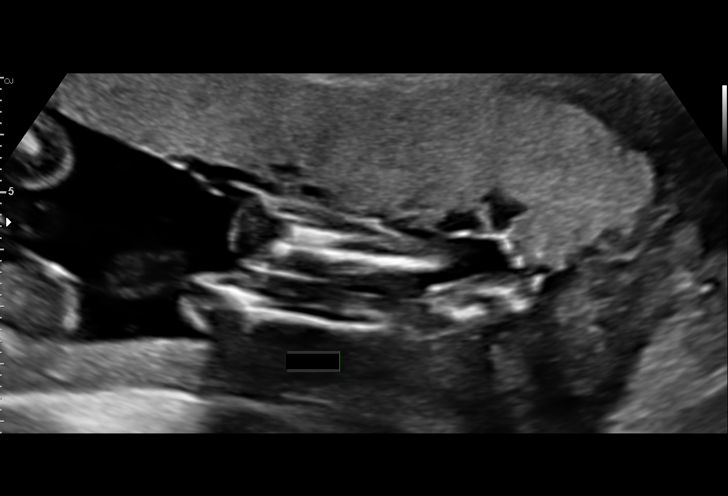
[im 58/63]
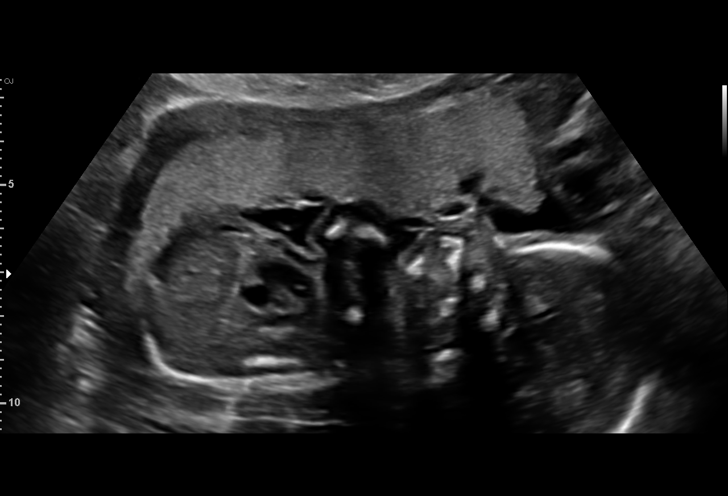
[im 63/63]
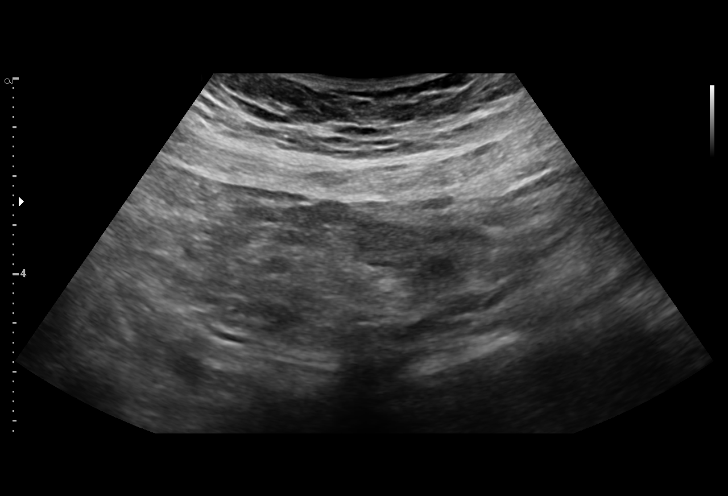

[14 of 28 positions shown; findings below may reference images not displayed]

Name:       KUDJOE FLY              Visit Date:  04/02/2017 [REDACTED]

1  JEORGE HAYASHI           [id number]      [id number]     [id number]
Indications

20 weeks gestation of pregnancy
Encounter for antenatal screening for
malformations
OB History

Blood Type:            Height:  5'3"   Weight (lb):  156      BMI:
Gravidity:    2         Term:   1        Prem:   0        SAB:   0
TOP:          0       Ectopic:  0        Living: 1
Fetal Evaluation

Num Of Fetuses:     1
Fetal Heart         149
Rate(bpm):
Cardiac Activity:   Observed
Presentation:       Cephalic
Placenta:           Anterior, above cervical os
P. Cord Insertion:  Visualized

Amniotic Fluid
AFI FV:      Subjectively within normal limits

Largest Pocket(cm)
5.77
Biometry

BPD:      50.4  mm     G. Age:  21w 2d         81  %    CI:        79.79   %   70 - 86
FL/HC:      18.4   %   16.8 -
HC:      178.3  mm     G. Age:  20w 2d         36  %    HC/AC:      1.16       1.09 -
AC:      153.1  mm     G. Age:  20w 4d         47  %    FL/BPD:     65.1   %
FL:       32.8  mm     G. Age:  20w 2d         36  %    FL/AC:      21.4   %   20 - 24
HUM:        34  mm     G. Age:  21w 4d         81  %
CER:      20.9  mm     G. Age:  19w 6d         38  %
CM:        7.4  mm

Est. FW:     352  gm    0 lb 12 oz      47  %
Gestational Age

U/S Today:     20w 4d                                        EDD:   [DATE]
Best:          20w 3d    Det. By:   Early Ultrasound         EDD:   08/16/17
(08/17/17)
Anatomy

Cranium:               Appears normal         Aortic Arch:            Appears normal
Cavum:                 Appears normal         Ductal Arch:            Appears normal
Ventricles:            Appears normal         Diaphragm:              Appears normal
Choroid Plexus:        Appears normal         Stomach:                Appears normal, left
sided
Cerebellum:            Appears normal         Abdomen:                Appears normal
Posterior Fossa:       Appears normal         Abdominal Wall:         Appears nml (cord
insert, abd wall)
Nuchal Fold:           Not applicable (>20    Cord Vessels:           Appears normal (3
wks GA)                                        vessel cord)
Face:                  Appears normal         Kidneys:                Appear normal
(orbits and profile)
Lips:                  Appears normal         Bladder:                Appears normal
Thoracic:              Appears normal         Spine:                  Appears normal
Heart:                 Appears normal         Upper Extremities:      Appears normal
(4CH, axis, and situs
RVOT:                  Appears normal         Lower Extremities:      Appears normal
LVOT:                  Appears normal

Other:  Male gender. Heels visualized.
Cervix Uterus Adnexa

Cervix
Length:            3.1  cm.
Normal appearance by transabdominal scan.

Uterus
Normal shape and size.

Left Ovary
Not visualized. No adnexal mass visualized.

Right Ovary
Size(cm)       2.6 x    0.9    x  3.1       Vol(ml):
Within normal limits.

Cul De Sac:   No free fluid seen.
Impression

SIUP at 20+3 weeks
Normal detailed fetal anatomy
Markers of aneuploidy: none
Normal amniotic fluid volume
Measurements consistent with early US
Recommendations

Follow-up as clinically indicated

## 2019-04-13 ENCOUNTER — Emergency Department (HOSPITAL_COMMUNITY)
Admission: EM | Admit: 2019-04-13 | Discharge: 2019-04-13 | Disposition: A | Discharge status: Home / Self Care | Payer: Medicaid Other | Attending: Emergency Medicine | Admitting: Emergency Medicine

## 2019-04-13 ENCOUNTER — Encounter (HOSPITAL_COMMUNITY): Payer: Self-pay

## 2019-04-13 ENCOUNTER — Other Ambulatory Visit: Payer: Self-pay

## 2019-04-13 DIAGNOSIS — J45909 Unspecified asthma, uncomplicated: Secondary | ICD-10-CM | POA: Insufficient documentation

## 2019-04-13 DIAGNOSIS — L02416 Cutaneous abscess of left lower limb: Secondary | ICD-10-CM | POA: Diagnosis present

## 2019-04-13 DIAGNOSIS — Z79899 Other long term (current) drug therapy: Secondary | ICD-10-CM | POA: Insufficient documentation

## 2019-04-13 MED ORDER — HYDROCODONE-ACETAMINOPHEN 5-325 MG PO TABS: 1.0000 | ORAL_TABLET | ORAL | 0 refills | Status: DC | PRN

## 2019-04-13 MED ORDER — CEPHALEXIN 500 MG PO CAPS: 500.0000 mg | ORAL_CAPSULE | Freq: Four times a day (QID) | ORAL | 0 refills | Status: DC

## 2019-04-13 MED ORDER — LIDOCAINE-EPINEPHRINE (PF) 2 %-1:200000 IJ SOLN
10.0000 mL | Freq: Once | INTRAMUSCULAR | Status: AC
  Administered 2019-04-13: 10 mL
  Filled 2019-04-13: qty 20

## 2019-04-13 MED ORDER — HYDROCODONE-ACETAMINOPHEN 5-325 MG PO TABS
1.0000 | ORAL_TABLET | Freq: Once | ORAL | Status: AC
  Administered 2019-04-13: 23:00:00 1 via ORAL
  Filled 2019-04-13: qty 1

## 2019-04-13 NOTE — ED Triage Notes (Signed)
Onset 2 days bump on posterior left upper leg.  No drainage.

## 2019-04-13 NOTE — ED Provider Notes (Signed)
[facility] Provider Note   CSN: [id number] Arrival date & time: 04/13/19  2057     History   Chief Complaint No chief complaint on file.   HPI Heather AvenaMartha Gould is a 23 y.o. female.     Patient to ED with painful swelling to posterior left thigh for the past several days that is getting progressively larger and more red. She felt as if is started like a 'hair bump". No fever. No drainage.   The history is provided by the patient. No language interpreter was used.    Past Medical History:  Diagnosis Date  . Asthma   . Medical history non-contributory     Patient Active Problem List   Diagnosis Date Noted  . Post-dates pregnancy 08/24/2017  . Eczema 08/06/2017  . Supervision of normal pregnancy 01/05/2017    Past Surgical History:  Procedure Laterality Date  . NO PAST SURGERIES       OB History    Gravida  2   Para  2   Term  2   Preterm      AB      Living  2     SAB      TAB      Ectopic      Multiple  0   Live Births  2            Home Medications    Prior to Admission medications   Medication Sig Start Date End Date Taking? Authorizing Provider  ibuprofen (ADVIL,MOTRIN) 600 MG tablet Take 1 tablet (600 mg total) every 6 (six) hours as needed by mouth. 08/26/17   Lockamy, Timothy, DO  Prenatal Vit-Fe Fumarate-FA (MULTIVITAMIN-PRENATAL) 27-0.8 MG TABS tablet Take 1 tablet by mouth daily at 12 noon.    [provider]  ranitidine (ZANTAC) 150 MG capsule Take 1 capsule (150 mg total) by mouth 2 (two) times daily. 06/25/17   Reva BoresPratt, Tanya S, MD  senna-docusate (SENOKOT-S) 8.6-50 MG tablet Take 2 tablets at bedtime as needed by mouth for mild constipation. 08/26/17   Arlyce HarmanLockamy, Timothy, DO  triamcinolone ointment (KENALOG) 0.5 % Apply 1 application topically 2 (two) times daily. Patient not taking: Reported on 08/24/2017 08/06/17   Marylene LandKooistra, Kathryn Lorraine, CNM    Family History Family History   Problem Relation Age of Onset  . Asthma Sister   . Asthma Brother     Social History Social History   Tobacco Use  . Smoking status: Never Smoker  . Smokeless tobacco: Never Used  Substance Use Topics  . Alcohol use: No  . Drug use: No     Allergies   Patient has no known allergies.   Review of Systems Review of Systems  Constitutional: Negative for fever.  Gastrointestinal: Negative for nausea.  Skin: Positive for wound.       See HPI.     Physical Exam Updated Vital Signs BP (!) 132/97   Pulse 98   Temp 98.9 F (37.2 C) (Oral)   Resp 16   LMP 04/02/2019   SpO2 97%   Physical Exam Constitutional:      Appearance: She is well-developed.  Neck:     Musculoskeletal: Normal range of motion.  Pulmonary:     Effort: Pulmonary effort is normal.  Skin:    General: Skin is warm and dry.     Comments: Posterior left thigh has an area measuring 7 cm, circular, of induration and redness. There is a central small  pustule present without active drainage.   Neurological:     Mental Status: She is alert and oriented to person, place, and time.      ED Treatments / Results  Labs (all labs ordered are listed, but only abnormal results are displayed) Labs Reviewed - No data to display  EKG None  Radiology No results found.  Procedures .Marland KitchenIncision and Drainage  Date/Time: 04/13/2019 11:37 PM Performed by: Charlann Lange, PA-C Authorized by: Charlann Lange, PA-C   Consent:    Consent obtained:  Verbal   Consent given by:  Patient Location:    Type:  Abscess   Location:  Lower extremity   Lower extremity location:  Leg   Leg location:  L upper leg Pre-procedure details:    Skin preparation:  Betadine Anesthesia (see MAR for exact dosages):    Anesthesia method:  Local infiltration   Local anesthetic:  Lidocaine 2% WITH epi Procedure type:    Complexity:  Simple Procedure details:    Incision types:  Stab incision   Scalpel blade:  11   Wound  management:  Probed and deloculated and irrigated with saline   Drainage:  Bloody and purulent   Drainage amount:  Moderate   Packing materials:  1/4 in iodoform gauze Post-procedure details:    Patient tolerance of procedure:  Tolerated well, no immediate complications   (including critical care time)  Medications Ordered in ED Medications  HYDROcodone-acetaminophen (NORCO/VICODIN) 5-325 MG per tablet 1 tablet (1 tablet Oral Given 04/13/19 2249)  lidocaine-EPINEPHrine (XYLOCAINE W/EPI) 2 %-1:200000 (PF) injection 10 mL (10 mLs Infiltration Given 04/13/19 2249)     Initial Impression / Assessment and Plan / ED Course  I have reviewed the triage vital signs and the nursing notes.  Pertinent labs & imaging results that were available during my care of the patient were reviewed by me and considered in my medical decision making (see chart for details).        Patient to ED with uncomplicated cutaneous abscess to posterior left thigh. No fever. I&D as per above note. After care instructions provided. Because of the surrounding area of cellulitis with start on Keflex.  Final Clinical Impressions(s) / ED Diagnoses   Final diagnoses:  None   1. Cutaneous abscess  ED Discharge Orders    None       Dennie Bible 04/13/19 2338    Isla Pence, MD 04/20/19 [phone]

## 2019-04-16 ENCOUNTER — Other Ambulatory Visit: Payer: Self-pay

## 2019-04-16 ENCOUNTER — Emergency Department (HOSPITAL_COMMUNITY)
Admission: EM | Admit: 2019-04-16 | Discharge: 2019-04-16 | Disposition: A | Discharge status: Home / Self Care | Payer: Medicaid Other | Attending: Emergency Medicine | Admitting: Emergency Medicine

## 2019-04-16 ENCOUNTER — Encounter (HOSPITAL_COMMUNITY): Payer: Self-pay

## 2019-04-16 DIAGNOSIS — Z5189 Encounter for other specified aftercare: Secondary | ICD-10-CM

## 2019-04-16 DIAGNOSIS — Z79899 Other long term (current) drug therapy: Secondary | ICD-10-CM | POA: Diagnosis not present

## 2019-04-16 DIAGNOSIS — Z4801 Encounter for change or removal of surgical wound dressing: Secondary | ICD-10-CM | POA: Diagnosis present

## 2019-04-16 DIAGNOSIS — L02416 Cutaneous abscess of left lower limb: Secondary | ICD-10-CM | POA: Insufficient documentation

## 2019-04-16 NOTE — ED Notes (Signed)
Patient verbalizes understanding of discharge instructions. Opportunity for questioning and answers were provided. Armband removed by staff, pt discharged from ED.  

## 2019-04-16 NOTE — ED Provider Notes (Signed)
[facility]   CSN: [id number] Arrival date & time: 04/16/19  1320    History   Chief Complaint Chief Complaint  Patient presents with  . Follow-up    HPI Ernie AvenaMartha Stofer is a 23 y.o. female.     23 year old female returns for packing removal.  Patient was to the ER 3 days ago for I&D of abscess to posterior left thigh.  Patient has been taking her antibiotics as prescribed, no complaints or concerns regarding the area.     Past Medical History:  Diagnosis Date  . Asthma   . Medical history non-contributory     Patient Active Problem List   Diagnosis Date Noted  . Post-dates pregnancy 08/24/2017  . Eczema 08/06/2017  . Supervision of normal pregnancy 01/05/2017    Past Surgical History:  Procedure Laterality Date  . NO PAST SURGERIES       OB History    Gravida  2   Para  2   Term  2   Preterm      AB      Living  2     SAB      TAB      Ectopic      Multiple  0   Live Births  2            Home Medications    Prior to Admission medications   Medication Sig Start Date End Date Taking? Authorizing Provider  cephALEXin (KEFLEX) 500 MG capsule Take 1 capsule (500 mg total) by mouth 4 (four) times daily. 04/13/19   Elpidio AnisUpstill, Shari, PA-C  HYDROcodone-acetaminophen (NORCO/VICODIN) 5-325 MG tablet Take 1 tablet by mouth every 4 (four) hours as needed. 04/13/19   Elpidio AnisUpstill, Shari, PA-C  ibuprofen (ADVIL,MOTRIN) 600 MG tablet Take 1 tablet (600 mg total) every 6 (six) hours as needed by mouth. 08/26/17   Lockamy, Timothy, DO  Prenatal Vit-Fe Fumarate-FA (MULTIVITAMIN-PRENATAL) 27-0.8 MG TABS tablet Take 1 tablet by mouth daily at 12 noon.    [provider]  ranitidine (ZANTAC) 150 MG capsule Take 1 capsule (150 mg total) by mouth 2 (two) times daily. 06/25/17   Reva BoresPratt, Tanya S, MD  senna-docusate (SENOKOT-S) 8.6-50 MG tablet Take 2 tablets at bedtime as needed by mouth for mild constipation.  08/26/17   Arlyce HarmanLockamy, Timothy, DO  triamcinolone ointment (KENALOG) 0.5 % Apply 1 application topically 2 (two) times daily. Patient not taking: Reported on 08/24/2017 08/06/17   Marylene LandKooistra, Kathryn Lorraine, CNM    Family History Family History  Problem Relation Age of Onset  . Asthma Sister   . Asthma Brother     Social History Social History   Tobacco Use  . Smoking status: Never Smoker  . Smokeless tobacco: Never Used  Substance Use Topics  . Alcohol use: No  . Drug use: No     Allergies   Patient has no known allergies.   Review of Systems Review of Systems  Constitutional: Negative for fever.  Musculoskeletal: Negative for arthralgias and myalgias.  Skin: Positive for wound.  Neurological: Negative for weakness and numbness.  Psychiatric/Behavioral: Negative for confusion.  All other systems reviewed and are negative.    Physical Exam Updated Vital Signs BP (!) 136/97 (BP Location: Right Arm)   Pulse 91   Temp 98.4 F (36.9 C) (Oral)   Resp 20   LMP 04/02/2019   SpO2 99%   Physical Exam Vitals signs and nursing note reviewed.  Constitutional:  General: She is not in acute distress.    Appearance: She is well-developed. She is not diaphoretic.  HENT:     Head: Normocephalic and atraumatic.  Cardiovascular:     Pulses: Normal pulses.  Pulmonary:     Effort: Pulmonary effort is normal.  Musculoskeletal:       Legs:  Skin:    General: Skin is warm and dry.     Findings: No erythema.  Neurological:     Mental Status: She is alert and oriented to person, place, and time.  Psychiatric:        Behavior: Behavior normal.      ED Treatments / Results  Labs (all labs ordered are listed, but only abnormal results are displayed) Labs Reviewed - No data to display  EKG None  Radiology No results found.  Procedures Procedures (including critical care time)  Medications Ordered in ED Medications - No data to display   Initial  Impression / Assessment and Plan / ED Course  I have reviewed the triage vital signs and the nursing notes.  Pertinent labs & imaging results that were available during my care of the patient were reviewed by me and considered in my medical decision making (see chart for details).  Clinical Course as of Apr 15 1349  Sat Apr 15, 6865  [id number] 23 year old female returns for packing removal.  Packing was removed without difficulty, no drainage present.  Recommend patient clean the area with mild soap, complete her antibiotics and recheck with PCP.   [LM]    Clinical Course User Index [LM] Tacy Learn, PA-C      Final Clinical Impressions(s) / ED Diagnoses   Final diagnoses:  Wound check, abscess    ED Discharge Orders    None       Tacy Learn, PA-C 04/16/19 [place], DO 04/16/19 1412

## 2019-04-16 NOTE — ED Triage Notes (Signed)
Pt presents for follow up of drainage of an abscess on back of left thigh. Pt states the pain has improved significantly today. States she was told to return here to have packing removed from wound.

## 2019-04-16 NOTE — Discharge Instructions (Addendum)
Clean with mild soap. Recheck with your doctor.  Complete your antibiotics.

## 2019-05-31 ENCOUNTER — Other Ambulatory Visit: Payer: Self-pay

## 2019-05-31 DIAGNOSIS — Z20822 Contact with and (suspected) exposure to covid-19: Secondary | ICD-10-CM

## 2019-06-01 LAB — NOVEL CORONAVIRUS, NAA: SARS-CoV-2, NAA: NOT DETECTED

## 2019-08-12 ENCOUNTER — Other Ambulatory Visit: Payer: Self-pay

## 2019-08-12 DIAGNOSIS — Z20822 Contact with and (suspected) exposure to covid-19: Secondary | ICD-10-CM

## 2019-08-13 LAB — NOVEL CORONAVIRUS, NAA: SARS-CoV-2, NAA: NOT DETECTED

## 2019-08-17 ENCOUNTER — Other Ambulatory Visit: Payer: Self-pay

## 2019-08-17 DIAGNOSIS — Z20822 Contact with and (suspected) exposure to covid-19: Secondary | ICD-10-CM

## 2019-08-18 LAB — NOVEL CORONAVIRUS, NAA: SARS-CoV-2, NAA: NOT DETECTED

## 2020-10-07 ENCOUNTER — Other Ambulatory Visit: Payer: Self-pay

## 2020-10-07 ENCOUNTER — Encounter (HOSPITAL_COMMUNITY): Payer: Self-pay

## 2020-10-07 ENCOUNTER — Emergency Department (HOSPITAL_COMMUNITY): Payer: Self-pay

## 2020-10-07 ENCOUNTER — Emergency Department (HOSPITAL_COMMUNITY)
Admission: EM | Admit: 2020-10-07 | Discharge: 2020-10-07 | Disposition: A | Discharge status: Home / Self Care | Payer: Self-pay | Attending: Emergency Medicine | Admitting: Emergency Medicine

## 2020-10-07 ENCOUNTER — Ambulatory Visit (HOSPITAL_COMMUNITY): Admission: EM | Admit: 2020-10-07 | Discharge: 2020-10-07 | Payer: Medicaid Other

## 2020-10-07 DIAGNOSIS — W260XXA Contact with knife, initial encounter: Secondary | ICD-10-CM | POA: Insufficient documentation

## 2020-10-07 DIAGNOSIS — T1490XA Injury, unspecified, initial encounter: Secondary | ICD-10-CM

## 2020-10-07 DIAGNOSIS — S61212A Laceration without foreign body of right middle finger without damage to nail, initial encounter: Secondary | ICD-10-CM | POA: Insufficient documentation

## 2020-10-07 DIAGNOSIS — S68119A Complete traumatic metacarpophalangeal amputation of unspecified finger, initial encounter: Secondary | ICD-10-CM

## 2020-10-07 DIAGNOSIS — J45909 Unspecified asthma, uncomplicated: Secondary | ICD-10-CM | POA: Insufficient documentation

## 2020-10-07 MED ORDER — LIDOCAINE HCL (PF) 1 % IJ SOLN
INTRAMUSCULAR | Status: AC
  Filled 2020-10-07: qty 5

## 2020-10-07 MED ORDER — LIDOCAINE HCL (PF) 2 % IJ SOLN
10.0000 mL | Freq: Once | INTRAMUSCULAR | Status: AC
  Administered 2020-10-07: 22:00:00 10 mL

## 2020-10-07 NOTE — ED Provider Notes (Signed)
[facility] EMERGENCY DEPARTMENT Provider Note   CSN: [id number] Arrival date & time: 10/07/20  1618     History No chief complaint on file.   Heather Gould is a 24 y.o. female.  The history is provided by the patient.  Laceration Location:  Finger Finger laceration location:  R index finger and R middle finger Length:  Tip of right index and 2cm on the right middle Depth:  Through dermis Quality: jagged   Bleeding: venous and controlled with pressure   Time since incident:  5 hours Laceration mechanism:  Knife (she was trying to open a box with a knife and it slipped cutting his finger) Pain details:    Quality:  Aching   Severity:  Moderate   Timing:  Constant   Progression:  Unchanged Foreign body present:  No foreign bodies Relieved by:  Pressure Worsened by:  Movement and pressure Ineffective treatments:  None tried Tetanus status:  Up to date Associated symptoms: no focal weakness, no numbness and no swelling        Past Medical History:  Diagnosis Date  . Asthma   . Medical history non-contributory     Patient Active Problem List   Diagnosis Date Noted  . Post-dates pregnancy 08/24/2017  . Eczema 08/06/2017  . Supervision of normal pregnancy 01/05/2017    Past Surgical History:  Procedure Laterality Date  . NO PAST SURGERIES       OB History    Gravida  2   Para  2   Term  2   Preterm      AB      Living  2     SAB      IAB      Ectopic      Multiple  0   Live Births  2           Family History  Problem Relation Age of Onset  . Asthma Sister   . Asthma Brother     Social History   Tobacco Use  . Smoking status: Never Smoker  . Smokeless tobacco: Never Used  Substance Use Topics  . Alcohol use: No  . Drug use: No    Home Medications Prior to Admission medications   Medication Sig Start Date End Date Taking? Authorizing Provider  cephALEXin (KEFLEX) 500 MG capsule Take 1 capsule (500 mg  total) by mouth 4 (four) times daily. Patient not taking: No sig reported 04/13/19   Elpidio Anis, PA-C  HYDROcodone-acetaminophen (NORCO/VICODIN) 5-325 MG tablet Take 1 tablet by mouth every 4 (four) hours as needed. Patient not taking: No sig reported 04/13/19   Elpidio Anis, PA-C  ibuprofen (ADVIL,MOTRIN) 600 MG tablet Take 1 tablet (600 mg total) every 6 (six) hours as needed by mouth. Patient not taking: No sig reported 08/26/17   Arlyce Harman, DO  ranitidine (ZANTAC) 150 MG capsule Take 1 capsule (150 mg total) by mouth 2 (two) times daily. Patient not taking: Reported on 10/07/2020 06/25/17   Reva Bores, MD  senna-docusate (SENOKOT-S) 8.6-50 MG tablet Take 2 tablets at bedtime as needed by mouth for mild constipation. Patient not taking: No sig reported 08/26/17   Arlyce Harman, DO  triamcinolone ointment (KENALOG) 0.5 % Apply 1 application topically 2 (two) times daily. Patient not taking: No sig reported 08/06/17   Marylene Land, CNM    Allergies    Patient has no known allergies.  Review of Systems   Review of Systems  Neurological: Negative for focal weakness.  All other systems reviewed and are negative.   Physical Exam Updated Vital Signs BP 119/79   Pulse 68   Temp 98.4 F (36.9 C) (Oral)   Resp 17   SpO2 98%   Physical Exam Vitals and nursing note reviewed.  Constitutional:      Appearance: Normal appearance. She is normal weight.  HENT:     Head: Normocephalic.  Cardiovascular:     Rate and Rhythm: Normal rate.  Pulmonary:     Effort: Pulmonary effort is normal.  Musculoskeletal:        General: Tenderness present.     Right hand: Laceration present.       Hands:  Skin:    General: Skin is warm.  Neurological:     General: No focal deficit present.     Mental Status: She is alert and oriented to person, place, and time. Mental status is at baseline.  Psychiatric:        Mood and Affect: Mood normal.        Behavior: Behavior  normal.        Thought Content: Thought content normal.      ED Results / Procedures / Treatments   Labs (all labs ordered are listed, but only abnormal results are displayed) Labs Reviewed - No data to display  EKG None  Radiology No results found.  Procedures Procedures (including critical care time)  LACERATION REPAIR Performed by: [employer] [facility] Authorized by: Gwyneth Sprout Consent: Verbal consent obtained. Risks and benefits: risks, benefits and alternatives were discussed Consent given by: patient Patient identity confirmed: provided demographic data Prepped and Draped in normal sterile fashion Wound explored  Laceration Location: right middle finger  Laceration Length: 2cm  No Foreign Bodies seen or palpated  Anesthesia: digital block  Local anesthetic: lidocaine 1% without epinephrine  Anesthetic total: 3 ml  Irrigation method: syringe Amount of cleaning: standard  Skin closure: 4.0 vicryl rapide  Number of sutures: 6  Technique: simple interrupted  Patient tolerance: Patient tolerated the procedure well with no immediate complications.   Medications Ordered in ED Medications  lidocaine HCl (PF) (XYLOCAINE) 2 % injection 10 mL (10 mLs Infiltration Given 10/07/20 2204)  lidocaine (PF) (XYLOCAINE) 1 % injection (  Given 10/07/20 2205)    ED Course  I have reviewed the triage vital signs and the nursing notes.  Pertinent labs & imaging results that were available during my care of the patient were reviewed by me and considered in my medical decision making (see chart for details).    MDM Rules/Calculators/A&P                          Patient presenting with laceration to the hand when the knife slipped.  She has a fingertip amputation with no obvious bone exposure.  We will do plain films to ensure no evidence close to bone exposure.  Also laceration to the right middle finger.  Laceration repair as above.  Sensation intact and no  evidence of tendon injury.  Tetanus shot is up-to-date.  Wounds cleaned and dressed.  Plain films neg.  Sent home with laceration precautions and f/u with hand surgery for any problems.  MDM Number of Diagnoses or Management Options   Amount and/or Complexity of Data Reviewed Tests in the radiology section of CPT: ordered and reviewed Independent visualization of images, tracings, or specimens: yes    Final Clinical Impression(s) / ED Diagnoses  Final diagnoses:  Fingertip amputation, initial encounter  Laceration of right middle finger without foreign body without damage to nail, initial encounter    Rx / DC Orders ED Discharge Orders    None       Gwyneth Sprout, MD 10/07/20 2344

## 2020-10-07 NOTE — ED Triage Notes (Signed)
Patient here with laceration to left hand middle and index finger with kitchen knife, arrived with dressing to same from an urgent care

## 2020-10-07 NOTE — ED Notes (Signed)
Patient is being discharged from the Urgent Care and sent to the Emergency Department via personal vehicle . Per provider Wallis Bamberg, patient is in need of higher level of care due to severity of finger laceration. Patient is aware and verbalizes understanding of plan of care. There were no vitals filed for this visit.

## 2020-10-07 NOTE — Discharge Instructions (Signed)
Can clean with soap and water.  Do not soak or scrub.  Keep covered and change bandage tomorrow.  Use ibuprofen and tylenol for pain and elevate to prevent throbbing.  Keep covered when it could get contaminated but can leave open to the air otherwise.  Stitches will fall out in about 7-10 days.

## 2024-03-27 ENCOUNTER — Emergency Department (HOSPITAL_COMMUNITY)
Admission: EM | Admit: 2024-03-27 | Discharge: 2024-03-27 | Disposition: A | Discharge status: Home / Self Care | Payer: Self-pay | Attending: Emergency Medicine | Admitting: Emergency Medicine

## 2024-03-27 ENCOUNTER — Encounter (HOSPITAL_COMMUNITY): Payer: Self-pay

## 2024-03-27 ENCOUNTER — Other Ambulatory Visit: Payer: Self-pay

## 2024-03-27 DIAGNOSIS — W01198A Fall on same level from slipping, tripping and stumbling with subsequent striking against other object, initial encounter: Secondary | ICD-10-CM | POA: Insufficient documentation

## 2024-03-27 DIAGNOSIS — S0512XA Contusion of eyeball and orbital tissues, left eye, initial encounter: Secondary | ICD-10-CM | POA: Insufficient documentation

## 2024-03-27 DIAGNOSIS — T148XXA Other injury of unspecified body region, initial encounter: Secondary | ICD-10-CM

## 2024-03-27 NOTE — Discharge Instructions (Signed)
 Please return for sudden worsening headache confusion or vomiting.  Please follow-up with your family doctor in the office.  Take 4 over the counter ibuprofen  tablets 3 times a day or 2 over-the-counter naproxen tablets twice a day for pain. Also take tylenol  1000mg (2 extra strength) four times a day.

## 2024-03-27 NOTE — ED Triage Notes (Addendum)
 Pt reports she tripped over her daughter's toys and fell down 3 stairs today. She states her head hit the corner of a bed. No LOC, no blood thinners. She has a hematoma to left eyebrow. Denies neck pain. Denies vision changes.

## 2024-03-27 NOTE — ED Provider Notes (Signed)
 [place] EMERGENCY DEPARTMENT AT [facility] Provider Note   CSN: [id number] Arrival date & time: 03/27/24  1829     Patient presents with: Heather Gould   Heather Gould is a 28 y.o. female.   28 yo F with a chief complaints of fall.  Patient was on her child's bed and she was trying to go down the stairs that lead up to the mattress and she had tripped and fell and struck her face on a toy that was on the ground.  She had significant pain and swelling above the left eye.  She denies other specific injury.  Denies confusion denies vomiting denies one-sided numbness or weakness denies difficulty speech or swallowing.   Fall       Prior to Admission medications   Medication Sig Start Date End Date Taking? Authorizing Provider  cephALEXin  (KEFLEX ) 500 MG capsule Take 1 capsule (500 mg total) by mouth 4 (four) times daily. Patient not taking: No sig reported 04/13/19   Mandy Second, PA-C  HYDROcodone -acetaminophen  (NORCO/VICODIN) 5-325 MG tablet Take 1 tablet by mouth every 4 (four) hours as needed. Patient not taking: No sig reported 04/13/19   Mandy Second, PA-C  ibuprofen  (ADVIL ,MOTRIN ) 600 MG tablet Take 1 tablet (600 mg total) every 6 (six) hours as needed by mouth. Patient not taking: No sig reported 08/26/17   Lacinda Pica, MD  ranitidine  (ZANTAC ) 150 MG capsule Take 1 capsule (150 mg total) by mouth 2 (two) times daily. Patient not taking: Reported on 10/07/2020 06/25/17   Granville Layer, MD  senna-docusate (SENOKOT-S) 8.6-50 MG tablet Take 2 tablets at bedtime as needed by mouth for mild constipation. Patient not taking: No sig reported 08/26/17   Lacinda Pica, MD  triamcinolone  ointment (KENALOG ) 0.5 % Apply 1 application topically 2 (two) times daily. Patient not taking: No sig reported 08/06/17   Kooistra, Kathryn Lorraine, CNM    Allergies: Bee venom    Review of Systems  Updated Vital Signs BP (!) 153/103 (BP Location: Right Arm)   Pulse 88   Temp  98.7 F (37.1 C)   Resp 16   Ht 5' 3 (1.6 m)   Wt 74.4 kg   LMP 03/22/2024 (Approximate)   SpO2 98%   Breastfeeding No   BMI 29.05 kg/m   Physical Exam Vitals and nursing note reviewed.  Constitutional:      General: She is not in acute distress.    Appearance: She is well-developed. She is not diaphoretic.  HENT:     Head: Normocephalic.     Comments: Patient has a hematoma just above the left eye.  Extraocular motions intact.  Pupils are equal round reactive to light and accommodation.  Eyes:     Pupils: Pupils are equal, round, and reactive to light.    Cardiovascular:     Rate and Rhythm: Normal rate and regular rhythm.     Heart sounds: No murmur heard.    No friction rub. No gallop.  Pulmonary:     Effort: Pulmonary effort is normal.     Breath sounds: No wheezing or rales.  Abdominal:     General: There is no distension.     Palpations: Abdomen is soft.     Tenderness: There is no abdominal tenderness.   Musculoskeletal:        General: No tenderness.     Cervical back: Normal range of motion and neck supple.   Skin:    General: Skin is warm and dry.  Neurological:     Mental Status: She is alert and oriented to person, place, and time.   Psychiatric:        Behavior: Behavior normal.     (all labs ordered are listed, but only abnormal results are displayed) Labs Reviewed - No data to display  EKG: None  Radiology: No results found.   Procedures   Medications Ordered in the ED - No data to display                                  Medical Decision Making  28 yo F with a chief complaints of a closed head injury.  Able to clear by [place] head CT rules.  No obvious concerning signs or symptoms consistent with a facial nerve injury or acute facial fracture.  Discussed symptomatic therapy at home.  PCP follow-up.  9:52 PM:  I have discussed the diagnosis/risks/treatment options with the patient and family.  Evaluation and diagnostic  testing in the emergency department does not suggest an emergent condition requiring admission or immediate intervention beyond what has been performed at this time.  They will follow up with PCP. We also discussed returning to the ED immediately if new or worsening sx occur. We discussed the sx which are most concerning (e.g., sudden worsening pain, fever, inability to tolerate by mouth) that necessitate immediate return. Medications administered to the patient during their visit and any new prescriptions provided to the patient are listed below.  Medications given during this visit Medications - No data to display   The patient appears reasonably screen and/or stabilized for discharge and I doubt any other medical condition or other [facility] requiring further screening, evaluation, or treatment in the ED at this time prior to discharge.       Final diagnoses:  Hematoma    ED Discharge Orders     None          Albertus Hughs, DO 03/27/24 2152

## 2024-06-02 ENCOUNTER — Other Ambulatory Visit: Payer: Self-pay

## 2024-06-02 ENCOUNTER — Emergency Department (HOSPITAL_COMMUNITY)
Admission: EM | Admit: 2024-06-02 | Discharge: 2024-06-02 | Disposition: A | Discharge status: Home / Self Care | Payer: Self-pay | Attending: Emergency Medicine | Admitting: Emergency Medicine

## 2024-06-02 ENCOUNTER — Encounter (HOSPITAL_COMMUNITY): Payer: Self-pay

## 2024-06-02 DIAGNOSIS — R040 Epistaxis: Secondary | ICD-10-CM | POA: Diagnosis present

## 2024-06-02 DIAGNOSIS — J45909 Unspecified asthma, uncomplicated: Secondary | ICD-10-CM | POA: Diagnosis not present

## 2024-06-02 MED ORDER — OXYMETAZOLINE HCL 0.05 % NA SOLN
1.0000 | Freq: Once | NASAL | Status: AC
  Administered 2024-06-02: 1 via NASAL
  Filled 2024-06-02: qty 30

## 2024-06-02 NOTE — Discharge Instructions (Signed)
 You are seen today for a nosebleed which is stopped.  If it recurs you can use the Afrin and apply pressure, if this does not stop the bleeding come back to the ER, otherwise follow-up with the ENT at the appointment you have tomorrow.
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
## 2024-06-02 NOTE — ED Notes (Signed)
 Pt/family received d/c paperwork at this time. After going over the paperwork any questions, comments, or concerns were answered to the best of this nurse's knowledge. The pt/family verbally acknowledged the teachings/instructions.
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
## 2024-10-31 ENCOUNTER — Ambulatory Visit (HOSPITAL_COMMUNITY)
Admission: EM | Admit: 2024-10-31 | Discharge: 2024-10-31 | Disposition: A | Discharge status: Home / Self Care | Source: Home / Self Care

## 2024-10-31 ENCOUNTER — Encounter (HOSPITAL_COMMUNITY): Payer: Self-pay

## 2024-10-31 DIAGNOSIS — B349 Viral infection, unspecified: Secondary | ICD-10-CM | POA: Diagnosis not present

## 2024-10-31 DIAGNOSIS — R051 Acute cough: Secondary | ICD-10-CM | POA: Diagnosis not present

## 2024-10-31 DIAGNOSIS — R350 Frequency of micturition: Secondary | ICD-10-CM | POA: Insufficient documentation

## 2024-10-31 LAB — POCT URINE PREGNANCY: Preg Test, Ur: POSITIVE — AB

## 2024-10-31 LAB — POCT URINE DIPSTICK
Bilirubin, UA: NEGATIVE
Glucose, UA: NEGATIVE mg/dL
Ketones, POC UA: NEGATIVE mg/dL
Leukocytes, UA: NEGATIVE
Nitrite, UA: NEGATIVE
POC PROTEIN,UA: NEGATIVE
Spec Grav, UA: 1.015
Urobilinogen, UA: 0.2 U/dL
pH, UA: 7

## 2024-10-31 LAB — POC SOFIA SARS ANTIGEN FIA: SARS Coronavirus 2 Ag: NEGATIVE

## 2024-10-31 LAB — POCT INFLUENZA A/B
Influenza A, POC: NEGATIVE
Influenza B, POC: NEGATIVE

## 2024-10-31 NOTE — Discharge Instructions (Signed)
 Your COVID and flu swabs are negative.   Your urinalysis is negative for infection.  We have sent a urine culture.  You will be notified via telephone if treatment is needed.    You have a viral illness which will improve on its own with rest, fluids, and medications to help with your symptoms.  Tylenol  as needed for pain, fever.   Guaifenesin  (plain mucinex ) for cough and congestion. Saline nasal sprays for nasal congestion.   Two teaspoons of honey in 1 cup of warm water every 4-6 hours may help with throat pains.  Humidifier in room at nighttime may help soothe cough (clean well daily).   For chest pain, shortness of breath, inability to keep food or fluids down without vomiting, fever that does not respond to tylenol  or motrin , or any other severe symptoms, please go to the ER for further evaluation. Return to urgent care as needed, otherwise follow-up with PCP.    You have been provided a handout for safe medications in pregnancy.

## 2024-10-31 NOTE — ED Triage Notes (Signed)
 Pt c/o cough, nasal congestion, body aches, back pain, nausea, and fatigue since 3am this morning. States she is [redacted]wks pregnant and can feel pressure/pain to vaginal wall. Denies taking any meds.

## 2024-10-31 NOTE — ED Provider Notes (Signed)
 " [facility]    CSN: [id number] Arrival date & time: 10/31/24  1723      History   Chief Complaint Chief Complaint  Patient presents with   Cough    HPI Heather Gould is a 29 y.o. female.   This 29 year old female is being seen for complaints of fatigue, body aches, nasal congestion, nonproductive cough, nausea, 2 episodes of diarrhea, back pain onset 3 AM.  She complains of urinary frequency.  She reports she is approximately [redacted] weeks pregnant with last menstrual cycle 10/01/2024.  She had a positive home pregnancy test.  She has not seen OB/GYN yet.  She reports when she coughs she can feel pelvic pressure.  She denies vaginal bleeding.  She denies headache, dizziness, ear pain, sore throat.  She denies chest pain, shortness of breath, abdominal pain, dysuria, urinary urgency.  She denies vomiting.  She has not taken anything for her symptoms.   Cough Associated symptoms: myalgias   Associated symptoms: no chest pain, no chills, no ear pain, no fever, no headaches, no rash, no shortness of breath and no sore throat     Past Medical History:  Diagnosis Date   Asthma    Medical history non-contributory     Patient Active Problem List   Diagnosis Date Noted   Post-dates pregnancy 08/24/2017   Eczema 08/06/2017   Supervision of normal pregnancy 01/05/2017    Past Surgical History:  Procedure Laterality Date   NO PAST SURGERIES      OB History     Gravida  3   Para  2   Term  2   Preterm      AB      Living  2      SAB      IAB      Ectopic      Multiple  0   Live Births  2            Home Medications    Prior to Admission medications  Medication Sig Start Date End Date Taking? Authorizing Provider  cephALEXin  (KEFLEX ) 500 MG capsule Take 1 capsule (500 mg total) by mouth 4 (four) times daily. Patient not taking: No sig reported 04/13/19   Odell Balls, PA-C  HYDROcodone -acetaminophen  (NORCO/VICODIN) 5-325 MG tablet Take 1  tablet by mouth every 4 (four) hours as needed. Patient not taking: No sig reported 04/13/19   Odell Balls, PA-C  ibuprofen  (ADVIL ,MOTRIN ) 600 MG tablet Take 1 tablet (600 mg total) every 6 (six) hours as needed by mouth. Patient not taking: No sig reported 08/26/17   Delane Lye, MD  ranitidine  (ZANTAC ) 150 MG capsule Take 1 capsule (150 mg total) by mouth 2 (two) times daily. Patient not taking: Reported on 10/07/2020 06/25/17   Fredirick Glenys RAMAN, MD  senna-docusate (SENOKOT-S) 8.6-50 MG tablet Take 2 tablets at bedtime as needed by mouth for mild constipation. Patient not taking: No sig reported 08/26/17   Delane Lye, MD  triamcinolone  ointment (KENALOG ) 0.5 % Apply 1 application topically 2 (two) times daily. Patient not taking: No sig reported 08/06/17   Kooistra, Kathryn Lorraine, CNM    Family History Family History  Problem Relation Age of Onset   Asthma Sister    Asthma Brother     Social History Social History[1]   Allergies   Bee venom   Review of Systems Review of Systems  Constitutional:  Positive for activity change, appetite change and fatigue. Negative for chills and fever.  HENT:  Positive for congestion. Negative for ear pain and sore throat.   Respiratory:  Positive for cough. Negative for shortness of breath.   Cardiovascular:  Negative for chest pain.  Gastrointestinal:  Positive for diarrhea and nausea. Negative for abdominal pain and vomiting.  Genitourinary:  Positive for pelvic pain (with coughing). Negative for vaginal bleeding.  Musculoskeletal:  Positive for myalgias.  Skin:  Negative for color change and rash.  Neurological:  Negative for dizziness and headaches.  All other systems reviewed and are negative.    Physical Exam Triage Vital Signs ED Triage Vitals  Encounter Vitals Group     BP 10/31/24 1936 (!) 146/79     Girls Systolic BP Percentile --      Girls Diastolic BP Percentile --      Boys Systolic BP Percentile --       Boys Diastolic BP Percentile --      Pulse Rate 10/31/24 1936 (!) 107     Resp 10/31/24 1936 16     Temp 10/31/24 1936 98.5 F (36.9 C)     Temp Source 10/31/24 1936 Oral     SpO2 10/31/24 1936 98 %     Weight --      Height --      Head Circumference --      Peak Flow --      Pain Score 10/31/24 1934 8     Pain Loc --      Pain Education --      Exclude from Growth Chart --    No data found.  Updated Vital Signs BP (!) 146/79 (BP Location: Left Arm)   Pulse (!) 107   Temp 98.5 F (36.9 C) (Oral)   Resp 16   LMP 10/01/2024 (Approximate)   SpO2 98%   Visual Acuity Right Eye Distance:   Left Eye Distance:   Bilateral Distance:    Right Eye Near:   Left Eye Near:    Bilateral Near:     Physical Exam Vitals and nursing note reviewed.  Constitutional:      General: She is not in acute distress.    Appearance: She is well-developed. She is not ill-appearing or toxic-appearing.     Comments: Female appearing stated age found sitting in chair in no acute distress.  HENT:     Head: Normocephalic and atraumatic.     Right Ear: Tympanic membrane and external ear normal.     Left Ear: Tympanic membrane and external ear normal.     Nose: Congestion present.     Mouth/Throat:     Lips: Pink.     Mouth: Mucous membranes are moist.     Pharynx: No oropharyngeal exudate or posterior oropharyngeal erythema.  Eyes:     Conjunctiva/sclera: Conjunctivae normal.  Cardiovascular:     Rate and Rhythm: Normal rate and regular rhythm.     Heart sounds: Normal heart sounds. No murmur heard. Pulmonary:     Effort: Pulmonary effort is normal. No respiratory distress.     Breath sounds: Normal breath sounds.  Abdominal:     General: Bowel sounds are normal.     Palpations: Abdomen is soft.     Tenderness: There is no abdominal tenderness.  Skin:    General: Skin is warm and dry.     Capillary Refill: Capillary refill takes less than 2 seconds.  Neurological:     Mental Status:  She is alert.  Psychiatric:  Mood and Affect: Mood normal.      UC Treatments / Results  Labs (all labs ordered are listed, but only abnormal results are displayed) Labs Reviewed  POCT URINE DIPSTICK - Abnormal; Notable for the following components:      Result Value   Clarity, UA cloudy (*)    Blood, UA moderate (*)    All other components within normal limits  POCT URINE PREGNANCY - Abnormal; Notable for the following components:   Preg Test, Ur Positive (*)    All other components within normal limits  URINE CULTURE  POCT INFLUENZA A/B  POC SOFIA SARS ANTIGEN FIA    EKG   Radiology No results found.  Procedures Procedures (including critical care time)  Medications Ordered in UC Medications - No data to display  Initial Impression / Assessment and Plan / UC Course  I have reviewed the triage vital signs and the nursing notes.  Pertinent labs & imaging results that were available during my care of the patient were reviewed by me and considered in my medical decision making (see chart for details).     Vitals and triage reviewed, patient is hemodynamically stable.  COVID and flu swabs obtained.  Urine pregnancy is positive.  UA is negative for infection, but has moderate RBCs.  Urine culture sent.  Presentation consistent with viral illness.  She is advised Tylenol , guaifenesin , saline nasal sprays.  She is advised honey water, cool-mist humidifier.  She is given handout for medication safe during pregnancy.  Advised to contact OB/GYN for this pregnancy.  Plan of care, follow-up care, return precautions given, no questions at this time. Final Clinical Impressions(s) / UC Diagnoses   Final diagnoses:  Acute cough  Urinary frequency   Discharge Instructions   None    ED Prescriptions   None    PDMP not reviewed this encounter.     [1]  Social History Tobacco Use   Smoking status: Never   Smokeless tobacco: Never  Substance Use Topics   Alcohol  use: No   Drug use: No     Lennice Jon BROCKS, FNP 10/31/24 2134  "

## 2024-11-01 LAB — URINE CULTURE: Culture: NO GROWTH

## 2024-11-02 ENCOUNTER — Ambulatory Visit (HOSPITAL_COMMUNITY): Payer: Self-pay

## 2024-11-02 ENCOUNTER — Ambulatory Visit
Admission: EM | Admit: 2024-11-02 | Discharge: 2024-11-02 | Disposition: A | Discharge status: Home / Self Care | Attending: Nurse Practitioner | Admitting: Nurse Practitioner

## 2024-11-02 DIAGNOSIS — J029 Acute pharyngitis, unspecified: Secondary | ICD-10-CM

## 2024-11-02 DIAGNOSIS — J069 Acute upper respiratory infection, unspecified: Secondary | ICD-10-CM

## 2024-11-02 LAB — POCT RAPID STREP A (OFFICE): Rapid Strep A Screen: NEGATIVE

## 2024-11-02 MED ORDER — FLUTICASONE PROPIONATE 50 MCG/ACT NA SUSP: 2.0000 | Freq: Every day | NASAL | 0 refills | Status: AC

## 2024-11-02 NOTE — ED Provider Notes (Signed)
 " [facility]    CSN: [id number] Arrival date & time: 11/02/24  1540      History   Chief Complaint No chief complaint on file.   HPI Heather Gould is a 29 y.o. female.   The history is provided by the patient.   Patient presents for complaints of sore throat, body aches, nasal congestion, and low back pain.  Patient states symptoms started 2 days ago.  She was seen at a [facility] urgent care, COVID/flu test was negative, and all other testing was negative, patient was diagnosed with a viral illness.  Denies fever, headache, ear pain, wheezing, difficulty breathing, abdominal pain, nausea, vomiting, diarrhea, or rash.  States that she has been taking over-the-counter Robitussin and Tylenol  for her symptoms.  Per review of the chart, patient is approximately [redacted] weeks pregnant.  Past Medical History:  Diagnosis Date   Asthma    Medical history non-contributory     Patient Active Problem List   Diagnosis Date Noted   Post-dates pregnancy 08/24/2017   Eczema 08/06/2017   Supervision of normal pregnancy 01/05/2017    Past Surgical History:  Procedure Laterality Date   NO PAST SURGERIES      OB History     Gravida  3   Para  2   Term  2   Preterm      AB      Living  2      SAB      IAB      Ectopic      Multiple  0   Live Births  2            Home Medications    Prior to Admission medications  Medication Sig Start Date End Date Taking? Authorizing Provider  fluticasone  (FLONASE ) 50 MCG/ACT nasal spray Place 2 sprays into both nostrils daily. 11/02/24  Yes Leath-Warren, Etta PARAS, NP    Family History Family History  Problem Relation Age of Onset   Asthma Sister    Asthma Brother     Social History Social History[1]   Allergies   Bee venom   Review of Systems Review of Systems Per HPI  Physical Exam Triage Vital Signs ED Triage Vitals  Encounter Vitals Group     BP 11/02/24 1545 112/73     Girls Systolic BP  Percentile --      Girls Diastolic BP Percentile --      Boys Systolic BP Percentile --      Boys Diastolic BP Percentile --      Pulse Rate 11/02/24 1545 92     Resp 11/02/24 1545 20     Temp 11/02/24 1545 98.2 F (36.8 C)     Temp Source 11/02/24 1545 Oral     SpO2 11/02/24 1545 94 %     Weight --      Height --      Head Circumference --      Peak Flow --      Pain Score 11/02/24 1550 9     Pain Loc --      Pain Education --      Exclude from Growth Chart --    No data found.  Updated Vital Signs BP 112/73 (BP Location: Right Arm)   Pulse 92   Temp 98.2 F (36.8 C) (Oral)   Resp 20   LMP 10/01/2024 (Approximate)   SpO2 94%   Visual Acuity Right Eye Distance:   Left Eye Distance:  Bilateral Distance:    Right Eye Near:   Left Eye Near:    Bilateral Near:     Physical Exam Vitals and nursing note reviewed.  Constitutional:      General: She is not in acute distress.    Appearance: Normal appearance.  HENT:     Head: Normocephalic.     Right Ear: Tympanic membrane, ear canal and external ear normal.     Left Ear: Tympanic membrane, ear canal and external ear normal.     Nose: Congestion present.     Mouth/Throat:     Lips: Pink.     Mouth: Mucous membranes are moist.     Pharynx: Pharyngeal swelling, posterior oropharyngeal erythema and postnasal drip present. No oropharyngeal exudate.     Tonsils: 1+ on the right. 1+ on the left.     Comments: Cobblestoning present to posterior oropharynx  Eyes:     Extraocular Movements: Extraocular movements intact.     Conjunctiva/sclera: Conjunctivae normal.     Pupils: Pupils are equal, round, and reactive to light.  Cardiovascular:     Rate and Rhythm: Normal rate and regular rhythm.     Pulses: Normal pulses.     Heart sounds: Normal heart sounds.  Pulmonary:     Effort: Pulmonary effort is normal.     Breath sounds: Normal breath sounds.  Abdominal:     General: Bowel sounds are normal.     Palpations:  Abdomen is soft.  Musculoskeletal:     Cervical back: Normal range of motion.  Skin:    General: Skin is warm and dry.  Neurological:     General: No focal deficit present.     Mental Status: She is alert and oriented to person, place, and time.  Psychiatric:        Mood and Affect: Mood normal.        Behavior: Behavior normal.      UC Treatments / Results  Labs (all labs ordered are listed, but only abnormal results are displayed) Labs Reviewed  CULTURE, GROUP A STREP [facility])  POCT RAPID STREP A (OFFICE)    EKG   Radiology No results found.  Procedures Procedures (including critical care time)  Medications Ordered in UC Medications - No data to display  Initial Impression / Assessment and Plan / UC Course  I have reviewed the triage vital signs and the nursing notes.  Pertinent labs & imaging results that were available during my care of the patient were reviewed by me and considered in my medical decision making (see chart for details).  The rapid strep test was negative.  Throat culture is pending.  Previous COVID/flu test were negative.  Symptoms consistent with viral etiology.  Will provide additional symptomatic treatment for her current symptoms to include fluticasone  50 mcg nasal spray for the nasal congestion.  Will have patient continue Robitussin for her cough.  Supportive care recommendations were provided and discussed with the patient to include fluids, rest, over-the-counter Tylenol , warm salt water gargles, and use of Chloraseptic throat spray or throat lozenges.  Patient was advised if symptoms fail to improve over the next 5 to 7 days, or begin to worsen, recommend follow-up in this clinic or with her primary care physician for further evaluation.  Patient was in agreement with this plan of care and verbalizes understanding.  All questions were answered.  Patient stable for discharge.   Final Clinical Impressions(s) / UC Diagnoses   Final diagnoses:   Sore throat  Viral URI with cough     Discharge Instructions      The rapid strep test is negative.  A throat culture is pending.  You will be contacted if the pending test result is abnormal.  You will also have access to the results via MyChart. Continue over-the-counter Tylenol  as needed for pain, fever, or general discomfort. Recommend warm salt water gargles 3-4 times daily as needed for throat pain or discomfort. You may use normal saline nasal spray throughout the day for nasal congestion or runny nose. For the cough, recommend the use of a humidifier in your bedroom at nighttime during sleep and sleeping elevated on pillows while symptoms persist. Symptoms should begin to improve over the next 5 to 7 days.  If symptoms fail to improve, or begin to worsen, you may follow-up in this clinic or with your primary care physician or obstetrician for further evaluation. Follow-up as needed.     ED Prescriptions     Medication Sig Dispense Auth. Provider   fluticasone  (FLONASE ) 50 MCG/ACT nasal spray Place 2 sprays into both nostrils daily. 16 g Leath-Warren, Etta PARAS, NP      PDMP not reviewed this encounter.     [1]  Social History Tobacco Use   Smoking status: Never   Smokeless tobacco: Never  Substance Use Topics   Alcohol use: No   Drug use: No     Gilmer Etta PARAS, NP 11/02/24 1609  "

## 2024-11-02 NOTE — ED Triage Notes (Signed)
 Pt reports sore throat, white spots on the back of the throat, lower back pain, body aches, was seen 2 days ago at [facility] was tested for COVID and Flu both tests negative.

## 2024-11-02 NOTE — Discharge Instructions (Addendum)
 The rapid strep test is negative.  A throat culture is pending.  You will be contacted if the pending test result is abnormal.  You will also have access to the results via MyChart. Continue over-the-counter Tylenol  as needed for pain, fever, or general discomfort. Recommend warm salt water gargles 3-4 times daily as needed for throat pain or discomfort. You may use normal saline nasal spray throughout the day for nasal congestion or runny nose. For the cough, recommend the use of a humidifier in your bedroom at nighttime during sleep and sleeping elevated on pillows while symptoms persist. Symptoms should begin to improve over the next 5 to 7 days.  If symptoms fail to improve, or begin to worsen, you may follow-up in this clinic or with your primary care physician or obstetrician for further evaluation. Follow-up as needed.
